# Patient Record
Sex: Female | Born: 1965
Health system: Southern US, Community
[De-identification: ages and names within clinical notes are randomized; demographics above are authoritative.]

## PROBLEM LIST (undated history)

## (undated) DIAGNOSIS — D649 Anemia, unspecified: Secondary | ICD-10-CM

## (undated) DIAGNOSIS — M419 Scoliosis, unspecified: Secondary | ICD-10-CM

## (undated) HISTORY — DX: Scoliosis, unspecified: M41.9

## (undated) HISTORY — PX: TUBAL LIGATION: SHX77

## (undated) SURGERY — EGD (ESOPHAGOGASTRODUODENOSCOPY)
Anesthesia: Moderate Sedation

---

## 2011-05-14 ENCOUNTER — Ambulatory Visit (INDEPENDENT_AMBULATORY_CARE_PROVIDER_SITE_OTHER): Payer: BC Managed Care – PPO | Admitting: Internal Medicine

## 2011-05-14 DIAGNOSIS — K625 Hemorrhage of anus and rectum: Secondary | ICD-10-CM

## 2011-05-14 DIAGNOSIS — Z800 Family history of malignant neoplasm of digestive organs: Secondary | ICD-10-CM

## 2011-05-14 DIAGNOSIS — D649 Anemia, unspecified: Secondary | ICD-10-CM

## 2011-06-02 NOTE — Consult Note (Signed)
[name], [name] NO.:  [id number]  MEDICAL RECORD NO.:  [id number]  LOCATION:                                 FACILITY:  PHYSICIAN:  Lionel December, M.D.    DATE OF BIRTH:  Jun 02, 1966  DATE OF CONSULTATION:  05/14/2011 DATE OF DISCHARGE:                                CONSULTATION   REASON FOR CONSULTATION:  Anemia, rectal bleeding.  HISTORY OF PRESENT ILLNESS:  Misty Mcdowell is a 45 year old female, referred to our office by Dr. Neita Carp for anemia.  She actually told Dr. Neita Carp that she had been seeing blood on the toilet tissue for a while.  On Apr 15, 2011, her hemoglobin was 12.7, hematocrit was 38.7, her MCV was 89.  She had actually had seen a dermatologist due to hair loss and her iron levels were checked.  It was noted her iron was 22, her ferritin was 6, her UIBC was 369, and her TIBC was 391.  She was started on iron at that time by her dermatologist.  She says her iron was rechecked and it was still low and she was sent to see Dr. Neita Carp.  She does tell me that she has actually had rectal bleeding since she had childbirth in 1992.  Her appetite is good.  There has been no unintentional weight loss.  She usually has a bowel movement a day.  They are dark in color or green at this time due to the iron.  Her last menstrual period was 26 days ago and she describes it as very heavy.  She has seen her gynecologist for her heavy vaginal bleeding and they are going to proceed with a Mirena for her. She does complain of being tired.  She denies pica.  There is no abdominal pain.  No dysphagia.  She denies any melena.  There is a family history of colon cancer in a first-degree relative.  Her father was diagnosed with colon cancer in 2010.  He was 43 years old.  ALLERGIES:  There are no known allergies.  HOME MEDICATIONS:  Include, iron 65 mg two a day, sumatriptan 50 mg as needed, ciclopirox nail solution for fungal infection, and  Excedrin p.r.n.  SURGERIES:  She has had a tubal ligation in 1992, anemia of new onset, migraines.  Please note that the patient is a Jehovah Witness and she does not want blood products.  FAMILY HISTORY:  Her mother is alive in good health.  Her father is alive with a history of colon cancer.  He was diagnosed in 2010 by Dr. Karilyn Cota at 62.  She has one brother in good health.  SOCIAL HISTORY:  She is married.  She works at [employer].  She does not smoke, drink, or do drugs, and she has two children in good health.  OBJECTIVE:  VITAL SIGNS:  Her weight is 143, her height is 5 feet 2-1/2 inches, temperature is 98.2, blood pressure is 94/62, and pulse is 72. HEENT:  She has natural teeth.  Her oral mucosa is moist.  There are no lesions.  Her conjunctivae is pink.  Her sclerae are anicteric.  Her thyroid is normal.  There is no cervical lymphadenopathy.  LUNGS:  Clear. HEART:  Regular rate and rhythm. ABDOMEN:  Soft.  Bowel sounds are positive.  No masses.  Her stool was dark today, but it was guaiac negative. EXTREMITIES:  There is no edema to her lower extremities.  ASSESSMENT:  Misty Mcdowell is a 45 year old female, presenting with a low ferritin.  However, her CBC is normal.  She has seen rectal bleeding off and on since childbirth in 1992.  A colonic neoplasm needs to be ruled out.  There is a positive family history of colon cancer in a first- degree relative. Hemorrhoids, polyps, AVMs are in the differential.  RECOMMENDATIONS:  Would like to schedule a colonoscopy in near future.   The risk were reviewed with the patient and the patient would like to  proceed with the procedure.    ______________________________ Dorene Ar, NP   ______________________________ Lionel December, M.D.    TS/MEDQ  D:  05/14/2011  T:  05/15/2011  Job:  [id number]  Electronically Signed by Dorene Ar PA on 05/15/2011 10:03:54 AM Electronically Signed by Lionel December M.D. on 06/02/2011 12:45:24 AM

## 2011-06-25 MED ORDER — SODIUM CHLORIDE 0.45 % IV SOLN: Freq: Once | INTRAVENOUS | Status: DC

## 2011-06-26 ENCOUNTER — Encounter (HOSPITAL_COMMUNITY): Payer: Self-pay | Admitting: *Deleted

## 2011-06-26 ENCOUNTER — Encounter (INDEPENDENT_AMBULATORY_CARE_PROVIDER_SITE_OTHER): Payer: BC Managed Care – PPO | Admitting: Internal Medicine

## 2011-06-26 ENCOUNTER — Other Ambulatory Visit (INDEPENDENT_AMBULATORY_CARE_PROVIDER_SITE_OTHER): Payer: Self-pay | Admitting: Internal Medicine

## 2011-06-26 ENCOUNTER — Ambulatory Visit (HOSPITAL_COMMUNITY)
Admission: RE | Admit: 2011-06-26 | Discharge: 2011-06-26 | Disposition: A | Discharge status: Home / Self Care | Payer: BC Managed Care – PPO | Source: Ambulatory Visit | Attending: Internal Medicine | Admitting: Internal Medicine

## 2011-06-26 ENCOUNTER — Encounter (HOSPITAL_COMMUNITY)
Admission: RE | Disposition: A | Discharge status: Home / Self Care | Payer: Self-pay | Source: Ambulatory Visit | Attending: Internal Medicine

## 2011-06-26 DIAGNOSIS — K644 Residual hemorrhoidal skin tags: Secondary | ICD-10-CM

## 2011-06-26 DIAGNOSIS — K5730 Diverticulosis of large intestine without perforation or abscess without bleeding: Secondary | ICD-10-CM

## 2011-06-26 DIAGNOSIS — Z800 Family history of malignant neoplasm of digestive organs: Secondary | ICD-10-CM | POA: Insufficient documentation

## 2011-06-26 DIAGNOSIS — K921 Melena: Secondary | ICD-10-CM

## 2011-06-26 DIAGNOSIS — K620 Anal polyp: Secondary | ICD-10-CM | POA: Insufficient documentation

## 2011-06-26 DIAGNOSIS — D509 Iron deficiency anemia, unspecified: Secondary | ICD-10-CM

## 2011-06-26 DIAGNOSIS — D129 Benign neoplasm of anus and anal canal: Secondary | ICD-10-CM

## 2011-06-26 DIAGNOSIS — D128 Benign neoplasm of rectum: Secondary | ICD-10-CM

## 2011-06-26 HISTORY — PX: COLONOSCOPY: SHX5424

## 2011-06-26 HISTORY — DX: Anemia, unspecified: D64.9

## 2011-06-26 SURGERY — COLONOSCOPY
Anesthesia: Moderate Sedation

## 2011-06-26 MED ORDER — MEPERIDINE HCL 50 MG/ML IJ SOLN
INTRAMUSCULAR | Status: DC | PRN
  Administered 2011-06-26 (×2): 25 mg via INTRAVENOUS

## 2011-06-26 MED ORDER — MEPERIDINE HCL 50 MG/ML IJ SOLN
INTRAMUSCULAR | Status: AC
  Filled 2011-06-26: qty 1

## 2011-06-26 MED ORDER — MIDAZOLAM HCL 5 MG/5ML IJ SOLN
INTRAMUSCULAR | Status: AC
  Filled 2011-06-26: qty 10

## 2011-06-26 MED ORDER — MIDAZOLAM HCL 5 MG/5ML IJ SOLN
INTRAMUSCULAR | Status: DC | PRN
  Administered 2011-06-26: 1 mg via INTRAVENOUS
  Administered 2011-06-26 (×2): 2 mg via INTRAVENOUS

## 2011-06-26 NOTE — Op Note (Signed)
NAMEANOKHI, Misty Mcdowell             ACCOUNT NO.:  [id number]  MEDICAL RECORD NO.:  [id number]  LOCATION:  [facility]                          FACILITY:  [facility]  PHYSICIAN:  Lionel December, M.D.    DATE OF BIRTH:  09-23-1966  DATE OF PROCEDURE:  06/26/2011 DATE OF DISCHARGE:  06/26/2011                              OPERATIVE REPORT   PROCEDURE:  Colonoscopy.  INDICATION:  Misty Mcdowell is a 45 year old Caucasian female who has a persistently low serum iron and has not responded to oral iron therapy, however, her hemoglobin has been normal.  She does complain of intermittent hematochezia thought to be secondary to hemorrhoids.  She does not have chronic diarrhea or weight loss.  Family history is significant for colon carcinoma in a father who had surgery at age 34 and doing well.  Her mother has had multiple polyps removed.  This is the patient's first exam.  MEDICATIONS FOR CONSCIOUS SEDATION:  Demerol 50 mg IV and Versed 5 mg IV.  FINDINGS:  Procedure performed in endoscopy suite.  The patient's vital signs and O2 sat were monitored during the procedure and remained stable.  The patient was placed in left lateral recumbent position. Rectal examination was performed.  No abnormality noted on external or digital exam.  Pentax videoscope was placed in rectum and advanced under vision into sigmoid colon and beyond.  Preparation was excellent.  She had a single small diverticulum at sigmoid colon.  Scope was passed into cecum which was identified by appendiceal orifice and ileocecal valve. Short segment of GI was also examined and was normal.  As the scope was withdrawn, colonic mucosa was carefully examined.  There was a 3-mm polyp at rectum which was ablated via cold biopsy.  Scope was retroflexed to examine anorectal junction and small hemorrhoids noted below the dentate line.  Endoscope was withdrawn.  Withdrawal time was 7 minutes.  The patient tolerated the procedure well.  FINAL  DIAGNOSES: 1. Normal terminal ileum. 2. Single small diverticulum at sigmoid colon. 3. A 3-mm rectal polyp removed via cold biopsy. 4. External hemorrhoids.  RECOMMENDATIONS: 1. The patient advised to go back on her iron pills as before. 2. I will be contacting the results of biopsy next week. 3. She will have follow up H and H and iron studies by Dr. Neita Carp in 8     weeks or so. 4. Given the family history, she should consider having a next     colonoscopy in 5 years.          ______________________________ Lionel December, M.D.     NR/MEDQ  D:  06/26/2011  T:  06/26/2011  Job:  [id number]  cc:   Fara Chute, MD Fax: [phone]

## 2011-06-26 NOTE — Brief Op Note (Signed)
06/26/2011  12:15 PM  PATIENT:  Misty Mcdowell  45 y.o. female  PRE-OPERATIVE DIAGNOSIS:  anemia, rectal bleeding,f/h crc  POST-OPERATIVE DIAGNOSIS:  sigmoid diverticula, 3mm rectal polyp, external hemorroids  PROCEDURE:  Procedure(s): COLONOSCOPY  SURGEON:  Surgeon(s): Malissa Hippo, MD   Colonoscopy note; Normal TI; Single sigmoid diverticulum; 3 mm rectal polyp removed via cold biopsy; External hemorrhoids.

## 2011-06-26 NOTE — H&P (Signed)
Misty Mcdowell is an 45 y.o. female.   Chief Complaint: For a colonoscopy. Persistently low serum iron levels and family history of colon carcinoma HPI: Patient is 45 year old Caucasian female with a persistently low serum iron levels despite taking oral iron for 4 months. She has occasional hematochezia felt to be secondary to hemorrhoids. Her periods according to her have never been heavy she had an IUD placed about a month ago. She denies anorexia weight loss diarrhea abdominal pain or heartburn. He does not take any NSAIDs on a regular basis. Father had surgery for colorectal carcinoma at age 14 and is doing well. Mother has had multiple polyps removed.  Past Medical History  Diagnosis Date  . Anemia     Past Surgical History  Procedure Date  . Tubal ligation     No family history on file. Social History:  reports that she has never smoked. She does not have any smokeless tobacco history on file. She reports that she does not drink alcohol or use illicit drugs.  Allergies: No Known Allergies  Medications Prior to Admission  Medication Dose Route Frequency Provider Last Rate Last Dose  . 0.45 % sodium chloride infusion   Intravenous Once Malissa Hippo, MD      . meperidine (DEMEROL) 50 MG/ML injection           . midazolam (VERSED) 5 MG/5ML injection            Medications Prior to Admission  Medication Sig Dispense Refill  . Iron 66 MG TABS Take by mouth 2 (two) times daily.          No results found for this or any previous visit (from the past 48 hour(s)). No results found.  Review of Systems  Gastrointestinal: Positive for blood in stool (intermittent with bowel movements always small volume). Negative for heartburn, nausea, vomiting, abdominal pain, diarrhea and constipation.    Blood pressure 109/65, pulse 58, temperature 98.8 F (37.1 C), temperature source Oral, resp. rate 18, height 5' 2.5" (1.588 m), weight 137 lb (62.143 kg), last menstrual period  06/06/2011, SpO2 98.00%. Physical Exam  Constitutional: She appears well-developed and well-nourished.  HENT:  Mouth/Throat: Oropharynx is clear and moist.  Eyes: Conjunctivae are normal. No scleral icterus.  Neck: No thyromegaly present.  Cardiovascular: Normal rate, regular rhythm and normal heart sounds.   No murmur heard. Respiratory: Breath sounds normal.  GI: Soft. She exhibits no distension. There is no tenderness.  Musculoskeletal: She exhibits no edema.  Lymphadenopathy:    She has no cervical adenopathy.  Neurological: She is alert.  Skin: Skin is warm and dry.     Assessment/Plan Iron deficiency. Hematochezia Family history of colorectal carcinoma Will proceed with colonoscopy sui generis and reviewed with the patient and she is agreeable  Plumer Mittelstaedt U 06/26/2011, 11:49 AM

## 2011-07-01 ENCOUNTER — Encounter (HOSPITAL_COMMUNITY): Payer: Self-pay | Admitting: Internal Medicine

## 2011-07-02 ENCOUNTER — Other Ambulatory Visit: Payer: Self-pay | Admitting: Dermatology

## 2011-07-15 ENCOUNTER — Encounter (INDEPENDENT_AMBULATORY_CARE_PROVIDER_SITE_OTHER): Payer: Self-pay | Admitting: *Deleted

## 2014-04-18 ENCOUNTER — Telehealth: Payer: Self-pay | Admitting: *Deleted

## 2014-04-18 ENCOUNTER — Ambulatory Visit (INDEPENDENT_AMBULATORY_CARE_PROVIDER_SITE_OTHER): Payer: BC Managed Care – PPO | Admitting: Podiatry

## 2014-04-18 ENCOUNTER — Encounter: Payer: Self-pay | Admitting: Podiatry

## 2014-04-18 VITALS — BP 118/71 | HR 73 | Resp 16 | Ht 62.0 in | Wt 139.0 lb

## 2014-04-18 DIAGNOSIS — L608 Other nail disorders: Secondary | ICD-10-CM

## 2014-04-18 NOTE — Telephone Encounter (Signed)
Mycotic Toenails fragments sent to [facility] for fungal culture/ pas.  [facility] [place] [address] Ph: [phone]

## 2014-04-18 NOTE — Progress Notes (Signed)
   Subjective:    Patient ID: Misty Mcdowell, female    DOB: Feb 01, 1966, 48 y.o.   MRN: [id number]  HPI Comments: "I have these bad nails"  Patient c/o thick, discolored toenails, 2nd and 3rd toenails left, for several years, off and on. The toes are sore. She has been on lamisil 3 times, with one successful treatment. She has been using Vick's vaporub at home. No change.  She also has a concern that her feet burn only when its really hot outside-plantar bilateral. "The skin is hot"       Review of Systems  Musculoskeletal: Positive for back pain.  Skin:       Change in nails  All other systems reviewed and are negative.      Objective:   Physical Exam: I have reviewed her past medical history medications allergies surgeries social history and review of systems. Pulses are strongly palpable bilateral. Second and third toes of the left foot demonstrate thick yellow dystrophic possibly mycotic nail plate. Neurologic sensorium is intact per since once the monofilament deep tendon reflexes are intact bilateral. Muscle strength is 5 over 5 flexors plantar flexors inverters everters all intrinsic musculature is intact. Orthopedic evaluation demonstrates all joints distal to the ankle a full range of motion without crepitation mild flexible hammertoe deformities are noted bilateral left greater than right.        Assessment & Plan:  Assessment: Nail dystrophy possible onychomycosis second third digits of the left foot.  Plan: Discussed etiology pathology conservative or surgical therapies nail samples were taken today from toes #2 #3 of the left foot. I will followup with her once her samples return.

## 2014-04-18 NOTE — Patient Instructions (Signed)

## 2014-05-04 ENCOUNTER — Encounter: Payer: Self-pay | Admitting: Podiatry

## 2014-05-23 ENCOUNTER — Ambulatory Visit (INDEPENDENT_AMBULATORY_CARE_PROVIDER_SITE_OTHER): Payer: BC Managed Care – PPO | Admitting: Podiatry

## 2014-05-23 ENCOUNTER — Encounter: Payer: Self-pay | Admitting: Podiatry

## 2014-05-23 VITALS — BP 138/79 | HR 72 | Resp 16

## 2014-05-23 DIAGNOSIS — B351 Tinea unguium: Secondary | ICD-10-CM

## 2014-05-23 DIAGNOSIS — Z79899 Other long term (current) drug therapy: Secondary | ICD-10-CM

## 2014-05-23 LAB — HEPATIC FUNCTION PANEL
ALBUMIN: 4.4 g/dL (ref 3.5–5.2)
ALK PHOS: 36 U/L — AB (ref 39–117)
ALT: 21 U/L (ref 0–35)
AST: 21 U/L (ref 0–37)
BILIRUBIN INDIRECT: 0.4 mg/dL (ref 0.2–1.2)
Bilirubin, Direct: 0.1 mg/dL (ref 0.0–0.3)
TOTAL PROTEIN: 7 g/dL (ref 6.0–8.3)
Total Bilirubin: 0.5 mg/dL (ref 0.2–1.2)

## 2014-05-23 MED ORDER — TERBINAFINE HCL 250 MG PO TABS: 250.0000 mg | ORAL_TABLET | Freq: Every day | ORAL | Status: DC

## 2014-05-23 NOTE — Patient Instructions (Signed)

## 2014-05-23 NOTE — Progress Notes (Signed)
She presents today for a followup of her toenail results.  Objective: Vital signs are stable she is alert and oriented x3 no change in her nail plates as of yet. Her nail culture came back positive for saprophytic fungi..  Assessment: Onychomycosis.  Plan: Start her on long-term dose of Lamisil blood work was performed today we will continue to evaluate as necessary. She has had Lamisil for once the treated her toenails very well she then re\re grew the fungus and Lamisil did not treat them previously so at this point we're going to restart Lamisil see how this works for her in followup with her in one month for another set of blood or bile. Should this come back abnormal we will notify her immediately.

## 2014-06-05 ENCOUNTER — Telehealth: Payer: Self-pay | Admitting: *Deleted

## 2014-06-05 NOTE — Telephone Encounter (Signed)
Message copied by Lolita Rieger on Mon Jun 05, 2014 10:12 AM ------      Message from: Clovis Riley E      Created: Mon Jun 05, 2014  8:31 AM                   ----- Message -----         From: Garrel Ridgel, DPM         Sent: 05/23/2014   4:47 PM           To: Rip Harbour, [facility]            Liver profile looks good may continue medication. ------

## 2014-06-05 NOTE — Telephone Encounter (Signed)
I called and informed the patient that Dr. Milinda Pointer reviewed the lab results and stated everything looks good.  Continue the medication.  She stated okay, thank you.

## 2014-06-20 ENCOUNTER — Ambulatory Visit (INDEPENDENT_AMBULATORY_CARE_PROVIDER_SITE_OTHER): Payer: BC Managed Care – PPO | Admitting: Podiatry

## 2014-06-20 ENCOUNTER — Encounter: Payer: Self-pay | Admitting: Podiatry

## 2014-06-20 DIAGNOSIS — Z79899 Other long term (current) drug therapy: Secondary | ICD-10-CM

## 2014-06-20 LAB — HEPATIC FUNCTION PANEL
ALT: 10 U/L (ref 0–35)
AST: 13 U/L (ref 0–37)
Albumin: 4.2 g/dL (ref 3.5–5.2)
Alkaline Phosphatase: 33 U/L — ABNORMAL LOW (ref 39–117)
Bilirubin, Direct: 0.1 mg/dL (ref 0.0–0.3)
Indirect Bilirubin: 0.3 mg/dL (ref 0.2–1.2)
Total Bilirubin: 0.4 mg/dL (ref 0.2–1.2)
Total Protein: 6.5 g/dL (ref 6.0–8.3)

## 2014-06-20 MED ORDER — TERBINAFINE HCL 250 MG PO TABS: 250.0000 mg | ORAL_TABLET | Freq: Every day | ORAL | Status: DC

## 2014-06-20 NOTE — Progress Notes (Signed)
She presents today for followup of Lamisil therapy for treatment of onychomycosis. She denies fever chills nausea vomiting muscle aches pains and rashes.  Objective: Pulses are stable she is alert and oriented x3. Pulses are strongly palpable bilateral. No change in onychomycosis as of yet.  Assessment: Onychomycosis been treated with Lamisil therapy.  Plan: Another liver profile will be performed today and she was given another prescription for Lamisil 250 mg tablets 1 by mouth daily x90 days. I will followup with her in 4 weeks and call her should her blood work come back abnormal.

## 2014-06-21 ENCOUNTER — Telehealth: Payer: Self-pay | Admitting: *Deleted

## 2014-06-21 NOTE — Telephone Encounter (Signed)
Message copied by Dierdre Searles on Wed Jun 21, 2014  3:30 PM ------      Message from: Tyson Dense T      Created: Tue Jun 20, 2014  4:06 PM       ok ------

## 2014-06-21 NOTE — Telephone Encounter (Signed)
Called patient to let her know that her bloodwork was good

## 2014-10-12 ENCOUNTER — Ambulatory Visit (INDEPENDENT_AMBULATORY_CARE_PROVIDER_SITE_OTHER): Payer: BC Managed Care – PPO | Admitting: Podiatry

## 2014-10-12 VITALS — BP 142/82 | HR 71 | Resp 16

## 2014-10-12 DIAGNOSIS — B351 Tinea unguium: Secondary | ICD-10-CM

## 2014-10-13 NOTE — Progress Notes (Signed)
She presents today for follow-up of her Lamisil therapy for her toes #3 and 4 for left foot. She states she finished the Lamisil therapy and has seen no change with the toenails.  Objective: Vital signs are stable she is alert and oriented 3. Onychomycosis with nail dystrophy toes 3 and 4 left foot.  Plan follow up with me in January for laser therapy.

## 2014-10-24 ENCOUNTER — Ambulatory Visit: Payer: BC Managed Care – PPO | Admitting: Podiatry

## 2015-09-19 ENCOUNTER — Ambulatory Visit (INDEPENDENT_AMBULATORY_CARE_PROVIDER_SITE_OTHER): Payer: BLUE CROSS/BLUE SHIELD | Admitting: Internal Medicine

## 2015-09-19 ENCOUNTER — Encounter (INDEPENDENT_AMBULATORY_CARE_PROVIDER_SITE_OTHER): Payer: Self-pay | Admitting: Internal Medicine

## 2015-09-19 ENCOUNTER — Encounter (INDEPENDENT_AMBULATORY_CARE_PROVIDER_SITE_OTHER): Payer: Self-pay | Admitting: *Deleted

## 2015-09-19 ENCOUNTER — Other Ambulatory Visit (INDEPENDENT_AMBULATORY_CARE_PROVIDER_SITE_OTHER): Payer: Self-pay | Admitting: Internal Medicine

## 2015-09-19 VITALS — BP 102/60 | HR 89 | Temp 98.0°F | Ht 62.5 in | Wt 128.5 lb

## 2015-09-19 DIAGNOSIS — R140 Abdominal distension (gaseous): Secondary | ICD-10-CM

## 2015-09-19 DIAGNOSIS — R1013 Epigastric pain: Secondary | ICD-10-CM

## 2015-09-19 DIAGNOSIS — M4120 Other idiopathic scoliosis, site unspecified: Secondary | ICD-10-CM | POA: Insufficient documentation

## 2015-09-19 MED ORDER — OMEPRAZOLE 20 MG PO CPDR: 20.0000 mg | DELAYED_RELEASE_CAPSULE | Freq: Every day | ORAL | Status: DC

## 2015-09-19 NOTE — Patient Instructions (Addendum)
EGD. The risks and benefits such as perforation, bleeding, and infection were reviewed with the patient and is agreeable. Rx for Omeprazole 20mg 

## 2015-09-19 NOTE — Progress Notes (Signed)
   Subjective:    Patient ID: Misty Mcdowell, female    DOB: [birth year], 49 y.o.   MRN: [id number]  HPI Presents today with c/o bloating and epigastric pain.  She says it is worse after a meal. The puffiness in her epigastric region never goes away completely. She denies any acid reflux. She has tried Prilosec but did not see any difference. No nausea or vomiting.  She was last seen by Dr. Doristine Mango for bloating and epigastric pain. She tells me she has bloating sensation above her umbilicus. She was constipated but is better since starting the Linzess. She has lost 15 pounds intentionally to try to reduce to bloating. Her appetite is good. She has a BM once every 3-4 days. Stools are soft.  No melena or BRRB.  She stopped the Excedrin for Migraines in April.    07/23/2015 US pelvic complete: Stable small uterine fibroids, IUD visualized within endometrial cavity. 2.5 cm complex left ovarian cyst, with; inderterminate but probably benign characteristics. Continued f/u by transvaginal pelvis US 6-12 weeks recommended.   04/02/2015 Transvaginal US: pelvic pressure, pain, bloating x 1 month. Within the left ovary there is a complicated cystic lesion with suggestion of internal septations and surround blood flow. Findings may represent a resolving corpus luteum or multiple adjacent cysts however not definitive for this etiology. Recommend f/u pelvic US in 6-8 weeks to evaluate for interval resolution.    03/08/2015 US abdomen: abdominal pain and bloating:  Normal exam.  CBC. 3.5  06/26/2011 Colonoscopy. Dr. Laural Golden  INDICATION: Misty Mcdowell is a 49 year old Caucasian female who has a persistently low serum iron and has not responded to oral iron therapy, however, her hemoglobin has been normal. She does complain of intermittent hematochezia thought to be secondary to hemorrhoids. She does not have chronic diarrhea or weight loss. Family history is significant for colon carcinoma in a  father who had surgery at age 20 and doing well. Her mother has had multiple polyps removed. 1. Normal terminal ileum. 2. Single small diverticulum at sigmoid colon. 3. A 3-mm rectal polyp removed via cold biopsy. 4. External hemorrhoids. Biopsy: polypoid colorectal mucosa with lymphoid aggregate. Review of Systems     Past Medical History  Diagnosis Date  . Anemia   . Scoliosis     Past Surgical History  Procedure Laterality Date  . Tubal ligation    . Colonoscopy  06/26/2011    Procedure: COLONOSCOPY;  Surgeon: Rogene Houston, MD;  Location: [facility];  Service: Endoscopy;  Laterality: N/A;    No Known Allergies  No current outpatient prescriptions on file prior to visit.   No current facility-administered medications on file prior to visit.     Objective:   Physical Exam Blood pressure 102/60, pulse 89, temperature 98 F (36.7 C), height 5' 2.5" (1.588 m), weight 128 lb 8 oz (58.287 kg). Alert and oriented. Skin warm and dry. Oral mucosa is moist.   . Sclera anicteric, conjunctivae is pink. Thyroid not enlarged. No cervical lymphadenopathy. Lungs clear. Heart regular rate and rhythm.  Abdomen is soft. Bowel sounds are positive. No hepatomegaly. No abdominal masses felt. Tenderness epigastric region.   No edema to lower extremities.        Assessment & Plan:  Epigastric tenderness. PUD needs to be ruled out. EGD.The risks and benefits such as perforation, bleeding, and infection were reviewed with the patient and is agreeable.

## 2015-09-26 ENCOUNTER — Encounter (HOSPITAL_COMMUNITY): Payer: Self-pay | Admitting: *Deleted

## 2015-09-26 ENCOUNTER — Encounter (HOSPITAL_COMMUNITY)
Admission: RE | Disposition: A | Discharge status: Home / Self Care | Payer: Self-pay | Source: Ambulatory Visit | Attending: Internal Medicine

## 2015-09-26 ENCOUNTER — Ambulatory Visit (HOSPITAL_COMMUNITY)
Admission: RE | Admit: 2015-09-26 | Discharge: 2015-09-26 | Disposition: A | Discharge status: Home / Self Care | Payer: BLUE CROSS/BLUE SHIELD | Source: Ambulatory Visit | Attending: Internal Medicine | Admitting: Internal Medicine

## 2015-09-26 DIAGNOSIS — Z79899 Other long term (current) drug therapy: Secondary | ICD-10-CM | POA: Diagnosis not present

## 2015-09-26 DIAGNOSIS — K2970 Gastritis, unspecified, without bleeding: Secondary | ICD-10-CM | POA: Insufficient documentation

## 2015-09-26 DIAGNOSIS — B9681 Helicobacter pylori [H. pylori] as the cause of diseases classified elsewhere: Secondary | ICD-10-CM | POA: Diagnosis not present

## 2015-09-26 DIAGNOSIS — K449 Diaphragmatic hernia without obstruction or gangrene: Secondary | ICD-10-CM | POA: Insufficient documentation

## 2015-09-26 DIAGNOSIS — K3189 Other diseases of stomach and duodenum: Secondary | ICD-10-CM | POA: Insufficient documentation

## 2015-09-26 DIAGNOSIS — R1013 Epigastric pain: Secondary | ICD-10-CM

## 2015-09-26 DIAGNOSIS — R140 Abdominal distension (gaseous): Secondary | ICD-10-CM | POA: Insufficient documentation

## 2015-09-26 DIAGNOSIS — R110 Nausea: Secondary | ICD-10-CM | POA: Insufficient documentation

## 2015-09-26 DIAGNOSIS — K5909 Other constipation: Secondary | ICD-10-CM | POA: Diagnosis not present

## 2015-09-26 HISTORY — PX: ESOPHAGOGASTRODUODENOSCOPY: SHX5428

## 2015-09-26 SURGERY — EGD (ESOPHAGOGASTRODUODENOSCOPY)
Anesthesia: Moderate Sedation

## 2015-09-26 MED ORDER — STERILE WATER FOR IRRIGATION IR SOLN
Status: DC | PRN
  Administered 2015-09-26: 15:00:00

## 2015-09-26 MED ORDER — MEPERIDINE HCL 50 MG/ML IJ SOLN
INTRAMUSCULAR | Status: AC
  Filled 2015-09-26: qty 1

## 2015-09-26 MED ORDER — DICYCLOMINE HCL 10 MG PO CAPS: 10.0000 mg | ORAL_CAPSULE | Freq: Three times a day (TID) | ORAL | Status: DC | PRN

## 2015-09-26 MED ORDER — MIDAZOLAM HCL 5 MG/5ML IJ SOLN
INTRAMUSCULAR | Status: DC | PRN
  Administered 2015-09-26 (×2): 2 mg via INTRAVENOUS
  Administered 2015-09-26: 1 mg via INTRAVENOUS

## 2015-09-26 MED ORDER — SODIUM CHLORIDE 0.9 % IV SOLN
INTRAVENOUS | Status: DC
  Administered 2015-09-26: 1000 mL via INTRAVENOUS

## 2015-09-26 MED ORDER — MEPERIDINE HCL 50 MG/ML IJ SOLN
INTRAMUSCULAR | Status: DC | PRN
  Administered 2015-09-26 (×2): 25 mg via INTRAVENOUS

## 2015-09-26 MED ORDER — MIDAZOLAM HCL 5 MG/5ML IJ SOLN
INTRAMUSCULAR | Status: AC
  Filled 2015-09-26: qty 10

## 2015-09-26 NOTE — Op Note (Signed)
EGD PROCEDURE REPORT  PATIENT:  Misty Mcdowell  MR#:  [id number] Birthdate:  [birth year], 49 y.o., female Endoscopist:  Dr. Rogene Houston, MD Referred By:  Dr. Eddie Dibbles w. Sasser, MD Procedure Date: 09/26/2015  Procedure:   EGD  Indications:   Patient is 49 year old Caucasian female who presents with 6-7 month history of epigastric pain postprandial bloating nausea and she's had 2 episodes of emesis. No history of melena. She has not responded to PPI therapy. She had upper abdominal ultrasound by Dr. Consuello Masse and was negative for cholelithiasis or other abnormalities. Patient's LFTs were normal. She has not responded to PPI therapy. She is undergoing diagnostic EGD.           Informed Consent:  The risks, benefits, alternatives & imponderables which include, but are not limited to, bleeding, infection, perforation, drug reaction and potential missed lesion have been reviewed.  The potential for biopsy, lesion removal, esophageal dilation, etc. have also been discussed.  Questions have been answered.  All parties agreeable.  Please see history & physical in medical record for more information.  Medications:  Demerol 50 mg IV Versed 5 mg IV Cetacaine spray topically for oropharyngeal anesthesia  Description of procedure:  The endoscope was introduced through the mouth and advanced to the second portion of the duodenum without difficulty or limitations. The mucosal surfaces were surveyed very carefully during advancement of the scope and upon withdrawal.  Findings:  Esophagus:   Mucosa of the esophagus was normal. GE junction was wavy otherwise unremarkable. GEJ:  34 cm Hiatus:  36 cm Stomach:   Stomach was empty and distended very well with insufflation. Folds in the proximal stomach were normal. Examination of mucosa at gastric body revealed patchy erythema and mosaic pattern. Antral mucosa was normal. Pyloric channel was patent. Angularis was unremarkable. Fundus and cardia were also seen  on retroflex view and no other abnormalities noted. Duodenum:   Normal bulbar and post bulbar mucosa.  Therapeutic/Diagnostic Maneuvers Performed:   Multiple biopsies taken from mucosa gastric body for routine histology.  Complications:   none  EBL: Minimal  Impression:  Small sliding hiatal hernia.  Mild portal gastropathy. Biopsy taken from mucosa of gastric body for routine histology.   These findings would not explain patient's symptom complex..   Recommendations:   Standard instructions given.  Dicyclomine 10 mg by mouth 3 times a day when necessary.  I would be contacting patient with biopsy results and further recommendations.  Jaquan Sadowsky U  09/26/2015  3:36 PM  CC: Dr. Manon Hilding, MD & Dr. Rayne Du ref. provider found

## 2015-09-26 NOTE — H&P (Signed)
Misty Mcdowell is an 49 y.o. female.   Chief Complaint:   Patient is here for EGD. HPI:  Patient is 49 year old Caucasian female who presents with 6-7 month history of epigastric pain postprandial bloating nausea. She took omeprazole for 2 weeks but it did not help. She went back on and one week ago. She denies melena or rectal bleeding. She has had 2 episodes of emesis but no hematemesis. She denies melena or rectal bleeding. She has chronic constipation and she feels she is doing very well with linzess.  She has bowel movement every 3 days. He denies frequent heartburn or dysphagia.  She is presently not taking any OTC NSAIDs. In the past she has taken maybe 3-4 times a month. She has lost 15 pounds but she states her weight loss is voluntary.  Abdominal ultrasound was negative for cholelithiasis.  Patient does not smoke cigarettes and drinks alcohol socially but not daily.  Past Medical History  Diagnosis Date  . Anemia   . Scoliosis     Past Surgical History  Procedure Laterality Date  . Tubal ligation    . Colonoscopy  06/26/2011    Procedure: COLONOSCOPY;  Surgeon: Rogene Houston, MD;  Location: [facility];  Service: Endoscopy;  Laterality: N/A;    History reviewed. No pertinent family history. Social History:  reports that she has never smoked. She does not have any smokeless tobacco history on file. She reports that she drinks alcohol. She reports that she does not use illicit drugs.  Allergies: No Known Allergies  Medications Prior to Admission  Medication Sig Dispense Refill  . cholecalciferol (VITAMIN D) 1000 UNITS tablet Take 1,000 Units by mouth daily.    . Fish Oil-Cholecalciferol (FISH OIL + D3 PO) Take by mouth.    . Linaclotide (LINZESS) 145 MCG CAPS capsule Take 145 mcg by mouth daily.    Marland Kitchen omeprazole (PRILOSEC) 20 MG capsule Take 1 capsule (20 mg total) by mouth daily. 90 capsule 3    No results found for this or any previous visit (from the past 48  hour(s)). No results found.  ROS  Blood pressure 114/72, pulse 63, temperature 98.3 F (36.8 C), temperature source Oral, resp. rate 18, height 5' 2.5" (1.588 m), weight 127 lb (57.607 kg), SpO2 100 %. Physical Exam  Constitutional: She appears well-developed and well-nourished.  HENT:  Mouth/Throat: Oropharynx is clear and moist.  Eyes: Conjunctivae are normal. No scleral icterus.  Neck: No thyromegaly present.  Cardiovascular: Normal rate, regular rhythm and normal heart sounds.   No murmur heard. Respiratory: Effort normal and breath sounds normal.  GI: Soft. She exhibits no mass. There is tenderness ( mild midepigastric tenderness). There is no rebound.  Musculoskeletal: She exhibits no edema.  Lymphadenopathy:    She has no cervical adenopathy.  Neurological: She is alert.  Skin: Skin is warm and dry.     Assessment/Plan  Epigastric pain postprandial bloating and nausea.  Diagnostic EGD.  REHMAN,NAJEEB U 09/26/2015, 3:12 PM

## 2015-09-26 NOTE — Discharge Instructions (Signed)
Resume usual medications and diet.  Dicyclomine 10 mg by mouth 3 times a day as needed or 30 minutes before each meal.  No driving for 24 hours.  Physician will call with biopsy results and further recommendations.  Gastrointestinal Endoscopy, Care After Refer to this sheet in the next few weeks. These instructions provide you with information on caring for yourself after your procedure. Your caregiver may also give you more specific instructions. Your treatment has been planned according to current medical practices, but problems sometimes occur. Call your caregiver if you have any problems or questions after your procedure. HOME CARE INSTRUCTIONS  If you were given medicine to help you relax (sedative), do not drive, operate machinery, or sign important documents for 24 hours.  Avoid alcohol and hot or warm beverages for the first 24 hours after the procedure.  Only take over-the-counter or prescription medicines for pain, discomfort, or fever as directed by your caregiver. You may resume taking your normal medicines unless your caregiver tells you otherwise. Ask your caregiver when you may resume taking medicines that may cause bleeding, such as aspirin, clopidogrel, or warfarin.  You may return to your normal diet and activities on the day after your procedure, or as directed by your caregiver. Walking may help to reduce any bloated feeling in your abdomen.  Drink enough fluids to keep your urine clear or pale yellow.  You may gargle with salt water if you have a sore throat. SEEK IMMEDIATE MEDICAL CARE IF:  You have severe nausea or vomiting.  You have severe abdominal pain, abdominal cramps that last longer than 6 hours, or abdominal swelling (distention).  You have severe shoulder or back pain.  You have trouble swallowing.  You have shortness of breath, your breathing is shallow, or you are breathing faster than normal.  You have a fever or a rapid heartbeat.  You vomit  blood or material that looks like coffee grounds.  You have bloody, black, or tarry stools. MAKE SURE YOU:  Understand these instructions.  Will watch your condition.  Will get help right away if you are not doing well or get worse.   This information is not intended to replace advice given to you by your health care provider. Make sure you discuss any questions you have with your health care provider.   Document Released: 07/01/2004 Document Revised: 12/08/2014 Document Reviewed: 02/17/2012 Elsevier Interactive Patient Education [employer].

## 2015-10-01 ENCOUNTER — Encounter (HOSPITAL_COMMUNITY): Payer: Self-pay | Admitting: Internal Medicine

## 2015-10-01 ENCOUNTER — Telehealth (INDEPENDENT_AMBULATORY_CARE_PROVIDER_SITE_OTHER): Payer: Self-pay | Admitting: Internal Medicine

## 2015-10-01 MED ORDER — BIS SUBCIT-METRONID-TETRACYC 140-125-125 MG PO CAPS: 3.0000 | ORAL_CAPSULE | Freq: Three times a day (TID) | ORAL | Status: DC

## 2015-10-01 NOTE — Telephone Encounter (Signed)
Gastric biopsy shows H. pylori infection. Results reviewed with patient. Prescription for Pylera sent to patient's pharmacy.

## 2015-10-02 ENCOUNTER — Encounter (INDEPENDENT_AMBULATORY_CARE_PROVIDER_SITE_OTHER): Payer: Self-pay | Admitting: Internal Medicine

## 2015-10-03 ENCOUNTER — Encounter (INDEPENDENT_AMBULATORY_CARE_PROVIDER_SITE_OTHER): Payer: Self-pay | Admitting: Internal Medicine

## 2015-10-03 ENCOUNTER — Encounter (HOSPITAL_COMMUNITY): Payer: Self-pay | Admitting: Internal Medicine

## 2015-10-05 ENCOUNTER — Encounter (INDEPENDENT_AMBULATORY_CARE_PROVIDER_SITE_OTHER): Payer: Self-pay

## 2015-10-11 ENCOUNTER — Telehealth (INDEPENDENT_AMBULATORY_CARE_PROVIDER_SITE_OTHER): Payer: Self-pay | Admitting: *Deleted

## 2015-10-11 NOTE — Telephone Encounter (Signed)
Misty Mcdowell finished her antibiotic today and was wanting to know if she should go on a probiotic now. The return phone number is [phone].

## 2015-10-11 NOTE — Telephone Encounter (Signed)
I advised her since she is having normal stools just to wait.

## 2015-10-23 ENCOUNTER — Ambulatory Visit (INDEPENDENT_AMBULATORY_CARE_PROVIDER_SITE_OTHER): Payer: BLUE CROSS/BLUE SHIELD | Admitting: Internal Medicine

## 2015-10-23 ENCOUNTER — Encounter (INDEPENDENT_AMBULATORY_CARE_PROVIDER_SITE_OTHER): Payer: Self-pay | Admitting: Internal Medicine

## 2015-10-23 VITALS — BP 108/62 | HR 64 | Temp 98.0°F | Ht 62.5 in | Wt 129.4 lb

## 2015-10-23 DIAGNOSIS — K2970 Gastritis, unspecified, without bleeding: Secondary | ICD-10-CM | POA: Diagnosis not present

## 2015-10-23 DIAGNOSIS — B9681 Helicobacter pylori [H. pylori] as the cause of diseases classified elsewhere: Secondary | ICD-10-CM

## 2015-10-23 DIAGNOSIS — R1013 Epigastric pain: Secondary | ICD-10-CM

## 2015-10-23 DIAGNOSIS — A048 Other specified bacterial intestinal infections: Secondary | ICD-10-CM | POA: Insufficient documentation

## 2015-10-23 MED ORDER — OMEPRAZOLE 20 MG PO CPDR: 20.0000 mg | DELAYED_RELEASE_CAPSULE | Freq: Every day | ORAL | Status: DC

## 2015-10-23 NOTE — Patient Instructions (Signed)
Omeprazole 30 minutes before breakfast and 30 minutes before supper.

## 2015-10-23 NOTE — Progress Notes (Addendum)
   Subjective:    Patient ID: Misty Mcdowell, female    DOB: December 09, 1965, 49 y.o.   MRN: [id number]  HPIHere today for f/u after recent EGD in October for epigatric pain and postprandial bloating and nausea. No hx of melena.  On EGD she was found to have H. Pylori gastritis and was given an Rx for Pylera. She presents today for f/u. She tells me she feels the same. She is taking the omeprazole. Her symptoms are worse when she eats or on a full stomach. Appetite is good. No weight loss. Usually has a BM daily. She usues Linzess daily.      09/26/2015: EGD  Indications: Patient is 49 year old Caucasian female who presents with 6-7 month history of epigastric pain postprandial bloating nausea and she's had 2 episodes of emesis. No history of melena. She has not responded to PPI therapy. She had upper abdominal ultrasound by Dr. Consuello Masse and was negative for cholelithiasis or other abnormalities. Patient's LFTs were normal. She has not responded to PPI therapy. She is undergoing diagnostic EGD. Impression: Small sliding hiatal hernia. Mild portal gastropathy. Biopsy taken from mucosa of gastric body for routine histology.  Patient has H. pylori gastritis and prescription for Pylera sent to her  pharmacy. She needs office visit in one month. Report to PCP   Review of Systems Past Medical History  Diagnosis Date  . Anemia   . Scoliosis     Past Surgical History  Procedure Laterality Date  . Tubal ligation    . Colonoscopy  06/26/2011    Procedure: COLONOSCOPY;  Surgeon: Rogene Houston, MD;  Location: [facility];  Service: Endoscopy;  Laterality: N/A;  . Esophagogastroduodenoscopy N/A 09/26/2015    Procedure: ESOPHAGOGASTRODUODENOSCOPY (EGD);  Surgeon: Rogene Houston, MD;  Location: [facility];  Service: Endoscopy;  Laterality: N/A;  3:00     No Known  Allergies  Current Outpatient Prescriptions on File Prior to Visit  Medication Sig Dispense Refill  . cholecalciferol (VITAMIN D) 1000 UNITS tablet Take 1,000 Units by mouth daily.    Marland Kitchen dicyclomine (BENTYL) 10 MG capsule Take 1 capsule (10 mg total) by mouth 3 (three) times daily as needed for spasms. 60 capsule 1  . Fish Oil-Cholecalciferol (FISH OIL + D3 PO) Take by mouth.    . Linaclotide (LINZESS) 145 MCG CAPS capsule Take 145 mcg by mouth daily.     No current facility-administered medications on file prior to visit.        Objective:   Physical Exam Blood pressure 108/62, pulse 64, temperature 98 F (36.7 C), height 5' 2.5" (1.588 m), weight 129 lb 6.4 oz (58.695 kg). Alert and oriented. Skin warm and dry. Oral mucosa is moist.   . Sclera anicteric, conjunctivae is pink. Thyroid not enlarged. No cervical lymphadenopathy. Lungs clear. Heart regular rate and rhythm.  Abdomen is soft. Bowel sounds are positive. No hepatomegaly. No abdominal masses felt. No tenderness.  No edema to lower extremities.         Assessment & Plan:  H. Pylori gastris. She feels better. She still has some bloating after eating or eating. Am going to increase her Omeprazole 20mg  to BID and she will call with a PR in 2 weeks. OV in 1 year.

## 2015-11-15 ENCOUNTER — Telehealth (INDEPENDENT_AMBULATORY_CARE_PROVIDER_SITE_OTHER): Payer: Self-pay | Admitting: *Deleted

## 2015-11-15 NOTE — Telephone Encounter (Signed)
She was to call with a progress report.  No change   You can call her at [phone]

## 2015-11-20 NOTE — Telephone Encounter (Signed)
She tells me she feels better. She does feel some better. She takes Dicyclomine as needed She needs to take the Dicyclomine 30 minutes before she eats.

## 2016-01-24 ENCOUNTER — Ambulatory Visit: Payer: BLUE CROSS/BLUE SHIELD

## 2016-01-24 ENCOUNTER — Encounter: Payer: Self-pay | Admitting: Podiatry

## 2016-01-24 ENCOUNTER — Ambulatory Visit (INDEPENDENT_AMBULATORY_CARE_PROVIDER_SITE_OTHER): Payer: BLUE CROSS/BLUE SHIELD | Admitting: Podiatry

## 2016-01-24 VITALS — BP 121/76 | HR 89 | Resp 12

## 2016-01-24 DIAGNOSIS — B351 Tinea unguium: Secondary | ICD-10-CM

## 2016-01-24 NOTE — Progress Notes (Signed)
   Subjective:    Patient ID: Misty Mcdowell, female    DOB: [birth year], 50 y.o.   MRN: [id number]  HPI Pt presents with thick discolored nails 2 and 3 left foot   Review of Systems    all other systems negative Objective:   Physical Exam  Thick discolored nails, onychomycosis nails 2,3       Assessment & Plan:  Laser administered today, approximately 1500 pulses delivered to left 2,3 nails, plus prophylactic treatment to left 1st, 4th and right 1st. All safety precautions in place. Pt tolerated well, no pain. Recommended formula 3 topical to affected nails only. Re-appointed in 1 month for laser re-treat

## 2016-02-21 ENCOUNTER — Other Ambulatory Visit: Payer: BLUE CROSS/BLUE SHIELD

## 2016-03-06 ENCOUNTER — Ambulatory Visit (INDEPENDENT_AMBULATORY_CARE_PROVIDER_SITE_OTHER): Payer: BLUE CROSS/BLUE SHIELD

## 2016-03-06 DIAGNOSIS — B351 Tinea unguium: Secondary | ICD-10-CM

## 2016-03-06 NOTE — Progress Notes (Signed)
   Subjective:    Patient ID: Misty Mcdowell, female    DOB: 12/10/1965, 49 y.o.   MRN: [id number]  HPI  Pt presents today for laser therapy #2 right 1st, left 1st,2nd, 3rd  Review of Systems    All other systems negative Objective:   Physical Exam Onychomycosis nails 2,3 left with minimal clearing       Assessment & Plan:  Debridement of nails and laser therapy administered with all safety precautions in place at approximately 1500 pulses. Re-appointed in 1 month for re-treatment

## 2016-03-26 DIAGNOSIS — M4125 Other idiopathic scoliosis, thoracolumbar region: Secondary | ICD-10-CM | POA: Diagnosis not present

## 2016-03-26 DIAGNOSIS — S335XXA Sprain of ligaments of lumbar spine, initial encounter: Secondary | ICD-10-CM | POA: Diagnosis not present

## 2016-03-26 DIAGNOSIS — S134XXA Sprain of ligaments of cervical spine, initial encounter: Secondary | ICD-10-CM | POA: Diagnosis not present

## 2016-04-03 ENCOUNTER — Other Ambulatory Visit: Payer: BLUE CROSS/BLUE SHIELD

## 2016-04-07 ENCOUNTER — Ambulatory Visit (INDEPENDENT_AMBULATORY_CARE_PROVIDER_SITE_OTHER): Payer: BLUE CROSS/BLUE SHIELD

## 2016-04-07 DIAGNOSIS — D259 Leiomyoma of uterus, unspecified: Secondary | ICD-10-CM | POA: Diagnosis not present

## 2016-04-07 DIAGNOSIS — N939 Abnormal uterine and vaginal bleeding, unspecified: Secondary | ICD-10-CM | POA: Diagnosis not present

## 2016-04-07 DIAGNOSIS — B351 Tinea unguium: Secondary | ICD-10-CM

## 2016-04-07 NOTE — Progress Notes (Signed)
   Subjective:    Patient ID: CINDEL LACAYO, female    DOB: Mar 01, 1966, 50 y.o.   MRN: [id number]  HPI  Pt presents today for laser therapy #2 right 1st, left 1st,2nd, 3rd  Review of Systems    All other systems negative Objective:   Physical Exam Onychomycosis nails 2,3 left with minimal clearing       Assessment & Plan:  Debridement of nails and laser therapy administered with all safety precautions in place at approximately 1500 pulses. Pictures of nails were taken to monitor progress. Re-appointed in 3 months for re-treatment

## 2016-04-14 DIAGNOSIS — N939 Abnormal uterine and vaginal bleeding, unspecified: Secondary | ICD-10-CM | POA: Diagnosis not present

## 2016-04-14 DIAGNOSIS — Z30430 Encounter for insertion of intrauterine contraceptive device: Secondary | ICD-10-CM | POA: Diagnosis not present

## 2016-04-18 DIAGNOSIS — Z23 Encounter for immunization: Secondary | ICD-10-CM | POA: Diagnosis not present

## 2016-04-22 DIAGNOSIS — S335XXA Sprain of ligaments of lumbar spine, initial encounter: Secondary | ICD-10-CM | POA: Diagnosis not present

## 2016-04-22 DIAGNOSIS — M4125 Other idiopathic scoliosis, thoracolumbar region: Secondary | ICD-10-CM | POA: Diagnosis not present

## 2016-04-22 DIAGNOSIS — S134XXA Sprain of ligaments of cervical spine, initial encounter: Secondary | ICD-10-CM | POA: Diagnosis not present

## 2016-05-16 DIAGNOSIS — D259 Leiomyoma of uterus, unspecified: Secondary | ICD-10-CM | POA: Diagnosis not present

## 2016-05-16 DIAGNOSIS — N841 Polyp of cervix uteri: Secondary | ICD-10-CM | POA: Diagnosis not present

## 2016-05-16 DIAGNOSIS — Z30430 Encounter for insertion of intrauterine contraceptive device: Secondary | ICD-10-CM | POA: Diagnosis not present

## 2016-05-16 DIAGNOSIS — Z9851 Tubal ligation status: Secondary | ICD-10-CM | POA: Diagnosis not present

## 2016-05-16 DIAGNOSIS — N920 Excessive and frequent menstruation with regular cycle: Secondary | ICD-10-CM | POA: Diagnosis not present

## 2016-05-16 DIAGNOSIS — G43829 Menstrual migraine, not intractable, without status migrainosus: Secondary | ICD-10-CM | POA: Diagnosis not present

## 2016-05-16 DIAGNOSIS — Z87891 Personal history of nicotine dependence: Secondary | ICD-10-CM | POA: Diagnosis not present

## 2016-05-16 DIAGNOSIS — N939 Abnormal uterine and vaginal bleeding, unspecified: Secondary | ICD-10-CM | POA: Diagnosis not present

## 2016-05-16 DIAGNOSIS — Z800 Family history of malignant neoplasm of digestive organs: Secondary | ICD-10-CM | POA: Diagnosis not present

## 2016-05-16 DIAGNOSIS — N938 Other specified abnormal uterine and vaginal bleeding: Secondary | ICD-10-CM | POA: Diagnosis not present

## 2016-05-19 DIAGNOSIS — S335XXA Sprain of ligaments of lumbar spine, initial encounter: Secondary | ICD-10-CM | POA: Diagnosis not present

## 2016-05-19 DIAGNOSIS — S134XXA Sprain of ligaments of cervical spine, initial encounter: Secondary | ICD-10-CM | POA: Diagnosis not present

## 2016-05-19 DIAGNOSIS — M4125 Other idiopathic scoliosis, thoracolumbar region: Secondary | ICD-10-CM | POA: Diagnosis not present

## 2016-06-06 DIAGNOSIS — Z1231 Encounter for screening mammogram for malignant neoplasm of breast: Secondary | ICD-10-CM | POA: Diagnosis not present

## 2016-06-11 ENCOUNTER — Telehealth (INDEPENDENT_AMBULATORY_CARE_PROVIDER_SITE_OTHER): Payer: Self-pay | Admitting: *Deleted

## 2016-06-11 ENCOUNTER — Encounter (INDEPENDENT_AMBULATORY_CARE_PROVIDER_SITE_OTHER): Payer: Self-pay | Admitting: *Deleted

## 2016-06-11 ENCOUNTER — Other Ambulatory Visit (INDEPENDENT_AMBULATORY_CARE_PROVIDER_SITE_OTHER): Payer: Self-pay | Admitting: *Deleted

## 2016-06-11 ENCOUNTER — Other Ambulatory Visit (INDEPENDENT_AMBULATORY_CARE_PROVIDER_SITE_OTHER): Payer: Self-pay | Admitting: Internal Medicine

## 2016-06-11 DIAGNOSIS — Z800 Family history of malignant neoplasm of digestive organs: Secondary | ICD-10-CM

## 2016-06-11 NOTE — Telephone Encounter (Signed)
Patient needs trilyte 

## 2016-06-11 NOTE — Telephone Encounter (Signed)
agree

## 2016-06-11 NOTE — Telephone Encounter (Signed)
Referring MD/PCP: sasser   Procedure: tcs  Reason/Indication:  fam hx colon ca  Has patient had this procedure before?  Yes, 2012 -- epic  If so, when, by whom and where?    Is there a family history of colon cancer?  Yes, father  Who?  What age when diagnosed?    Is patient diabetic?   no      Does patient have prosthetic heart valve or mechanical valve?  no  Do you have a pacemaker?  no  Has patient ever had endocarditis? no  Has patient had joint replacement within last 12 months?  no  Does patient tend to be constipated or take laxatives? yes  Does patient have a history of alcohol/drug use?  no  Is patient on Coumadin, Plavix and/or Aspirin? no  Medications: see epic  Allergies: nkda  Medication Adjustment:   Procedure date & time: 07/03/16

## 2016-06-12 MED ORDER — PEG 3350-KCL-NA BICARB-NACL 420 G PO SOLR: 4000.0000 mL | Freq: Once | ORAL | Status: DC

## 2016-06-16 DIAGNOSIS — S134XXA Sprain of ligaments of cervical spine, initial encounter: Secondary | ICD-10-CM | POA: Diagnosis not present

## 2016-06-16 DIAGNOSIS — M4125 Other idiopathic scoliosis, thoracolumbar region: Secondary | ICD-10-CM | POA: Diagnosis not present

## 2016-06-16 DIAGNOSIS — S335XXA Sprain of ligaments of lumbar spine, initial encounter: Secondary | ICD-10-CM | POA: Diagnosis not present

## 2016-06-20 DIAGNOSIS — S134XXA Sprain of ligaments of cervical spine, initial encounter: Secondary | ICD-10-CM | POA: Diagnosis not present

## 2016-06-20 DIAGNOSIS — S335XXA Sprain of ligaments of lumbar spine, initial encounter: Secondary | ICD-10-CM | POA: Diagnosis not present

## 2016-06-20 DIAGNOSIS — M4125 Other idiopathic scoliosis, thoracolumbar region: Secondary | ICD-10-CM | POA: Diagnosis not present

## 2016-07-03 ENCOUNTER — Other Ambulatory Visit: Payer: BLUE CROSS/BLUE SHIELD

## 2016-07-03 ENCOUNTER — Encounter (HOSPITAL_COMMUNITY): Payer: Self-pay | Admitting: *Deleted

## 2016-07-03 ENCOUNTER — Ambulatory Visit (HOSPITAL_COMMUNITY)
Admission: RE | Admit: 2016-07-03 | Discharge: 2016-07-03 | Disposition: A | Discharge status: Home / Self Care | Payer: BLUE CROSS/BLUE SHIELD | Source: Ambulatory Visit | Attending: Internal Medicine | Admitting: Internal Medicine

## 2016-07-03 ENCOUNTER — Encounter (HOSPITAL_COMMUNITY)
Admission: RE | Disposition: A | Discharge status: Home / Self Care | Payer: Self-pay | Source: Ambulatory Visit | Attending: Internal Medicine

## 2016-07-03 DIAGNOSIS — M419 Scoliosis, unspecified: Secondary | ICD-10-CM | POA: Insufficient documentation

## 2016-07-03 DIAGNOSIS — K644 Residual hemorrhoidal skin tags: Secondary | ICD-10-CM | POA: Diagnosis not present

## 2016-07-03 DIAGNOSIS — Z1211 Encounter for screening for malignant neoplasm of colon: Secondary | ICD-10-CM | POA: Insufficient documentation

## 2016-07-03 DIAGNOSIS — Z79899 Other long term (current) drug therapy: Secondary | ICD-10-CM | POA: Diagnosis not present

## 2016-07-03 DIAGNOSIS — Z800 Family history of malignant neoplasm of digestive organs: Secondary | ICD-10-CM | POA: Insufficient documentation

## 2016-07-03 DIAGNOSIS — D120 Benign neoplasm of cecum: Secondary | ICD-10-CM | POA: Diagnosis not present

## 2016-07-03 DIAGNOSIS — Z862 Personal history of diseases of the blood and blood-forming organs and certain disorders involving the immune mechanism: Secondary | ICD-10-CM | POA: Insufficient documentation

## 2016-07-03 HISTORY — PX: COLONOSCOPY: SHX5424

## 2016-07-03 SURGERY — COLONOSCOPY
Anesthesia: Moderate Sedation

## 2016-07-03 MED ORDER — MIDAZOLAM HCL 5 MG/5ML IJ SOLN
INTRAMUSCULAR | Status: DC | PRN
  Administered 2016-07-03: 1 mg via INTRAVENOUS
  Administered 2016-07-03 (×2): 2 mg via INTRAVENOUS
  Administered 2016-07-03 (×2): 1 mg via INTRAVENOUS

## 2016-07-03 MED ORDER — MIDAZOLAM HCL 5 MG/5ML IJ SOLN
INTRAMUSCULAR | Status: AC
  Filled 2016-07-03: qty 10

## 2016-07-03 MED ORDER — SODIUM CHLORIDE 0.9 % IV SOLN
INTRAVENOUS | Status: DC
  Administered 2016-07-03: 1000 mL via INTRAVENOUS

## 2016-07-03 MED ORDER — MEPERIDINE HCL 50 MG/ML IJ SOLN
INTRAMUSCULAR | Status: DC | PRN
  Administered 2016-07-03 (×2): 25 mg via INTRAVENOUS

## 2016-07-03 MED ORDER — SIMETHICONE 40 MG/0.6ML PO SUSP
ORAL | Status: DC | PRN
  Administered 2016-07-03: 09:00:00

## 2016-07-03 MED ORDER — MEPERIDINE HCL 50 MG/ML IJ SOLN
INTRAMUSCULAR | Status: AC
  Filled 2016-07-03: qty 1

## 2016-07-03 NOTE — Op Note (Signed)
[facility] Patient Name: Misty Mcdowell Procedure Date: 07/03/2016 9:10 AM MRN: [id number] Date of Birth: 06-29-1966 Attending MD: Hildred Laser , MD CSN: [id number] Age: 50 Admit Type: Outpatient Procedure:                Colonoscopy Indications:              Screening in patient at increased risk: Colorectal                            cancer in father 60 or older Providers:                Hildred Laser, MD, Otis Peak B. Sharon Seller, RN, Shelby Mattocks, Technician Referring MD:             Manon Hilding MD, MD Medicines:                Meperidine 50 mg IV, Midazolam 7 mg IV Complications:            No immediate complications. Estimated Blood Loss:     Estimated blood loss: none. Procedure:                Pre-Anesthesia Assessment:                           - Prior to the procedure, a History and Physical                            was performed, and patient medications and                            allergies were reviewed. The patient's tolerance of                            previous anesthesia was also reviewed. The risks                            and benefits of the procedure and the sedation                            options and risks were discussed with the patient.                            All questions were answered, and informed consent                            was obtained. Prior Anticoagulants: The patient has                            taken no previous anticoagulant or antiplatelet                            agents. ASA Grade Assessment: I - A normal, healthy  patient. After reviewing the risks and benefits,                            the patient was deemed in satisfactory condition to                            undergo the procedure.                           After obtaining informed consent, the colonoscope                            was passed under direct vision. Throughout the   procedure, the patient's blood pressure, pulse, and                            oxygen saturations were monitored continuously. The                            [id number]) scope was introduced through                            the anus and advanced to the the cecum, identified                            by appendiceal orifice and ileocecal valve. The                            colonoscopy was performed without difficulty. The                            patient tolerated the procedure well. The quality                            of the bowel preparation was good. The ileocecal                            valve, appendiceal orifice, and rectum were                            photographed. Scope In: 9:31:39 AM Scope Out: 9:55:22 AM Scope Withdrawal Time: 0 hours 11 minutes 32 seconds  Total Procedure Duration: 0 hours 23 minutes 43 seconds  Findings:      A 6 to 12 mm polyp was found in the cecum. The polyp was semi-sessile.       The polyp was removed with a hot snare. Resection and retrieval were       complete. To prevent bleeding after the polypectomy, one hemostatic clip       was successfully placed (MR conditional). There was no bleeding at the       end of the procedure.      The exam was otherwise without abnormality.      External hemorrhoids were found during retroflexion. The hemorrhoids       were small. Impression:               -  One 6 to 12 mm polyp in the cecum, removed with a                            hot snare. Resected and retrieved. Clip (MR                            conditional) was placed.                           - The examination was otherwise normal.                           - External hemorrhoids. Moderate Sedation:      Moderate (conscious) sedation was administered by the endoscopy nurse       and supervised by the endoscopist. The following parameters were       monitored: oxygen saturation, heart rate, blood pressure, CO2       capnography and  response to care. Total physician intraservice time was       29 minutes. Recommendation:           - Patient has a contact number available for                            emergencies. The signs and symptoms of potential                            delayed complications were discussed with the                            patient. Return to normal activities tomorrow.                            Written discharge instructions were provided to the                            patient.                           - Resume previous diet today.                           - Continue present medications.                           - No aspirin, ibuprofen, naproxen, or other                            non-steroidal anti-inflammatory drugs for 7 days                            after polyp removal.                           - Await pathology results.                           -  Repeat colonoscopy for surveillance based on                            pathology results. Procedure Code(s):        --- Professional ---                           [id number], Colonoscopy, flexible; with removal of                            tumor(s), polyp(s), or other lesion(s) by snare                            technique                           99152, Moderate sedation services provided by the                            same physician or other qualified health care                            professional performing the diagnostic or                            therapeutic service that the sedation supports,                            requiring the presence of an independent trained                            observer to assist in the monitoring of the                            patient's level of consciousness and physiological                            status; initial 15 minutes of intraservice time,                            patient age 86 years or older                           [phone], Moderate sedation services; each additional                             15 minutes intraservice time Diagnosis Code(s):        --- Professional ---                           Z80.0, Family history of malignant neoplasm of                            digestive organs  D12.0, Benign neoplasm of cecum                           K64.4, Residual hemorrhoidal skin tags CPT copyright 2016 American [employer] Association. All rights reserved. The codes documented in this report are preliminary and upon coder review may  be revised to meet current compliance requirements. Hildred Laser, MD Hildred Laser, MD 07/03/2016 10:02:21 AM This report has been signed electronically. Number of Addenda: 0

## 2016-07-03 NOTE — Discharge Instructions (Signed)
No aspirin or NSAIDs for 1 week. °Resume usual medications and diet. °No driving for 24 hours. °Physician will call with biopsy results. ° ° ° ° °Colonoscopy, Care After °These instructions give you information on caring for yourself after your procedure. Your doctor may also give you more specific instructions. Call your doctor if you have any problems or questions after your procedure. °HOME CARE °· Do not drive for 24 hours. °· Do not sign important papers or use machinery for 24 hours. °· You may shower. °· You may go back to your usual activities, but go slower for the first 24 hours. °· Take rest breaks often during the first 24 hours. °· Walk around or use warm packs on your belly (abdomen) if you have belly cramping or gas. °· Drink enough fluids to keep your pee (urine) clear or pale yellow. °· Resume your normal diet. Avoid heavy or fried foods. °· Avoid drinking alcohol for 24 hours or as told by your doctor. °· Only take medicines as told by your doctor. °If a tissue sample (biopsy) was taken during the procedure:  °· Do not take aspirin or blood thinners for 7 days, or as told by your doctor. °· Do not drink alcohol for 7 days, or as told by your doctor. °· Eat soft foods for the first 24 hours. °GET HELP IF: °You still have a small amount of blood in your poop (stool) 2-3 days after the procedure. °GET HELP RIGHT AWAY IF: °· You have more than a small amount of blood in your poop. °· You see clumps of tissue (blood clots) in your poop. °· Your belly is puffy (swollen). °· You feel sick to your stomach (nauseous) or throw up (vomit). °· You have a fever. °· You have belly pain that gets worse and medicine does not help. °MAKE SURE YOU: °· Understand these instructions. °· Will watch your condition. °· Will get help right away if you are not doing well or get worse. °  °This information is not intended to replace advice given to you by your health care provider. Make sure you discuss any questions you  have with your health care provider. °  °Document Released: 12/20/2010 Document Revised: 11/22/2013 Document Reviewed: 07/25/2013 °Elsevier Interactive Patient Education ©2016 Else[employer] ° ° °Colon Polyps °Polyps are lumps of extra tissue growing inside the body. Polyps can grow in the large intestine (colon). Most colon polyps are noncancerous (benign). However, some colon polyps can become cancerous over time. Polyps that are larger than a pea may be harmful. To be safe, caregivers remove and test all polyps. °CAUSES  °Polyps form when mutations in the genes cause your cells to grow and divide even though no more tissue is needed. °RISK FACTORS °There are a number of risk factors that can increase your chances of getting colon polyps. They include: °· Being older than 50 years. °· Family history of colon polyps or colon cancer. °· Long-term colon diseases, such as colitis or Crohn disease. °· Being overweight. °· Smoking. °· Being inactive. °· Drinking too much alcohol. °SYMPTOMS  °Most small polyps do not cause symptoms. If symptoms are present, they may include: °· Blood in the stool. The stool may look dark red or black. °· Constipation or diarrhea that lasts longer than 1 week. °DIAGNOSIS °People often do not know they have polyps until their caregiver finds them during a regular checkup. Your caregiver can use 4 tests to check for polyps: °· Digital rectal exam.   The caregiver wears gloves and feels inside the rectum. This test would find polyps only in the rectum. °· Barium enema. The caregiver puts a liquid called barium into your rectum before taking X-rays of your colon. Barium makes your colon look white. Polyps are dark, so they are easy to see in the X-ray pictures. °· Sigmoidoscopy. A thin, flexible tube (sigmoidoscope) is placed into your rectum. The sigmoidoscope has a light and tiny camera in it. The caregiver uses the sigmoidoscope to look at the last third of your colon. °· Colonoscopy. This  test is like sigmoidoscopy, but the caregiver looks at the entire colon. This is the most common method for finding and removing polyps. °TREATMENT  °Any polyps will be removed during a sigmoidoscopy or colonoscopy. The polyps are then tested for cancer. °PREVENTION  °To help lower your risk of getting more colon polyps: °· Eat plenty of fruits and vegetables. Avoid eating fatty foods. °· Do not smoke. °· Avoid drinking alcohol. °· Exercise every day. °· Lose weight if recommended by your caregiver. °· Eat plenty of calcium and folate. Foods that are rich in calcium include milk, cheese, and broccoli. Foods that are rich in folate include chickpeas, kidney beans, and spinach. °HOME CARE INSTRUCTIONS °Keep all follow-up appointments as directed by your caregiver. You may need periodic exams to check for polyps. °SEEK MEDICAL CARE IF: °You notice bleeding during a bowel movement. °  °This information is not intended to replace advice given to you by your health care provider. Make sure you discuss any questions you have with your health care provider. °  °Document Released: 08/13/2004 Document Revised: 12/08/2014 Document Reviewed: 01/27/2012 °Elsevier Interactive Patient Education ©2016 Elsevier Inc. ° °

## 2016-07-03 NOTE — H&P (Signed)
Misty Mcdowell is an 50 y.o. female.   Chief Complaint: Patient is here for colonoscopy. HPI: Patient is 50 year old Caucasian female who is here for high-risk screening colonoscopy. She denies abdominal pain change in bowel habits or rectal bleeding. Family history significant for CRC and father was diagnosed at age 20 and died of metastatic disease 4 years later. Family history is also significant for colonic polyps in mother. Patient's last exam was in July 2012 with removal of small polyp and was not an adenoma.  Past Medical History:  Diagnosis Date  . Anemia   . Scoliosis     Past Surgical History:  Procedure Laterality Date  . COLONOSCOPY  06/26/2011   Procedure: COLONOSCOPY;  Surgeon: Rogene Houston, MD;  Location: [facility];  Service: Endoscopy;  Laterality: N/A;  . ESOPHAGOGASTRODUODENOSCOPY N/A 09/26/2015   Procedure: ESOPHAGOGASTRODUODENOSCOPY (EGD);  Surgeon: Rogene Houston, MD;  Location: [facility];  Service: Endoscopy;  Laterality: N/A;  3:00   . TUBAL LIGATION      Family History  Problem Relation Age of Onset  . Hypertension Mother   . Colon cancer Father   . Lung cancer Paternal Grandmother    Social History:  reports that she has never smoked. She has never used smokeless tobacco. She reports that she drinks alcohol. She reports that she does not use drugs.  Allergies: No Known Allergies  Medications Prior to Admission  Medication Sig Dispense Refill  . Linaclotide (LINZESS) 145 MCG CAPS capsule Take 145 mcg by mouth daily.    . polyethylene glycol-electrolytes (TRILYTE) 420 g solution Take 4,000 mLs by mouth once. 4000 mL 0  . cholecalciferol (VITAMIN D) 1000 UNITS tablet Take 1,000 Units by mouth daily.    Marland Kitchen dicyclomine (BENTYL) 10 MG capsule Take 1 capsule (10 mg total) by mouth 3 (three) times daily as needed for spasms. 60 capsule 1  . Fish Oil-Cholecalciferol (FISH OIL + D3 PO) Take by mouth.    Marland Kitchen omeprazole (PRILOSEC) 20 MG capsule Take 1  capsule (20 mg total) by mouth daily. 60 capsule 1    No results found for this or any previous visit (from the past 48 hour(s)). No results found.  ROS  Blood pressure 120/83, pulse 66, temperature 98.9 F (37.2 C), temperature source Oral, resp. rate 19, height 5' 2.5" (1.588 m), weight 134 lb (60.8 kg), last menstrual period 06/26/2016, SpO2 99 %. Physical Exam  Constitutional: She appears well-developed and well-nourished.  HENT:  Mouth/Throat: Oropharynx is clear and moist.  Eyes: Conjunctivae are normal. No scleral icterus.  Neck: No thyromegaly present.  Cardiovascular: Normal rate, regular rhythm and normal heart sounds.   No murmur heard. Respiratory: Effort normal and breath sounds normal.  GI: Soft. She exhibits no distension and no mass. There is no tenderness.  Musculoskeletal: She exhibits no edema.  Lymphadenopathy:    She has no cervical adenopathy.  Neurological: She is alert.  Skin: Skin is warm and dry.     Assessment/Plan High risk screening colonoscopy.   Hildred Laser, MD 07/03/2016, 9:22 AM

## 2016-07-07 ENCOUNTER — Other Ambulatory Visit (INDEPENDENT_AMBULATORY_CARE_PROVIDER_SITE_OTHER): Payer: Self-pay | Admitting: Internal Medicine

## 2016-07-07 MED ORDER — LINACLOTIDE 145 MCG PO CAPS: 145.0000 ug | ORAL_CAPSULE | Freq: Every day | ORAL | 3 refills | Status: DC

## 2016-07-10 ENCOUNTER — Other Ambulatory Visit: Payer: BLUE CROSS/BLUE SHIELD

## 2016-07-10 ENCOUNTER — Encounter (HOSPITAL_COMMUNITY): Payer: Self-pay | Admitting: Internal Medicine

## 2016-07-14 DIAGNOSIS — S134XXA Sprain of ligaments of cervical spine, initial encounter: Secondary | ICD-10-CM | POA: Diagnosis not present

## 2016-07-14 DIAGNOSIS — M4125 Other idiopathic scoliosis, thoracolumbar region: Secondary | ICD-10-CM | POA: Diagnosis not present

## 2016-07-14 DIAGNOSIS — S335XXA Sprain of ligaments of lumbar spine, initial encounter: Secondary | ICD-10-CM | POA: Diagnosis not present

## 2016-07-16 DIAGNOSIS — Z6824 Body mass index (BMI) 24.0-24.9, adult: Secondary | ICD-10-CM | POA: Diagnosis not present

## 2016-07-16 DIAGNOSIS — Z01419 Encounter for gynecological examination (general) (routine) without abnormal findings: Secondary | ICD-10-CM | POA: Diagnosis not present

## 2016-07-17 ENCOUNTER — Encounter (INDEPENDENT_AMBULATORY_CARE_PROVIDER_SITE_OTHER): Payer: Self-pay | Admitting: Internal Medicine

## 2016-07-21 ENCOUNTER — Ambulatory Visit (INDEPENDENT_AMBULATORY_CARE_PROVIDER_SITE_OTHER): Payer: BLUE CROSS/BLUE SHIELD

## 2016-07-21 DIAGNOSIS — B351 Tinea unguium: Secondary | ICD-10-CM

## 2016-07-21 NOTE — Progress Notes (Signed)
   Subjective:    Patient ID: Misty Mcdowell, female    DOB: 1966/07/17, 50 y.o.   MRN: [id number]  HPI  Pt presents today for laser therapy #4 right 1st, left 1st,2nd, 3rd  Review of Systems    All other systems negative Objective:   Physical Exam Onychomycosis nails 2,3 left with minimal clearing       Assessment & Plan:  Debridement of nails and laser therapy administered with all safety precautions in place at approximately 1500 pulses. Re-appointed in 3 months for 5th treatment

## 2016-08-11 DIAGNOSIS — S335XXA Sprain of ligaments of lumbar spine, initial encounter: Secondary | ICD-10-CM | POA: Diagnosis not present

## 2016-08-11 DIAGNOSIS — M4125 Other idiopathic scoliosis, thoracolumbar region: Secondary | ICD-10-CM | POA: Diagnosis not present

## 2016-08-11 DIAGNOSIS — S134XXA Sprain of ligaments of cervical spine, initial encounter: Secondary | ICD-10-CM | POA: Diagnosis not present

## 2016-09-10 DIAGNOSIS — S335XXA Sprain of ligaments of lumbar spine, initial encounter: Secondary | ICD-10-CM | POA: Diagnosis not present

## 2016-09-10 DIAGNOSIS — S134XXA Sprain of ligaments of cervical spine, initial encounter: Secondary | ICD-10-CM | POA: Diagnosis not present

## 2016-09-10 DIAGNOSIS — M4125 Other idiopathic scoliosis, thoracolumbar region: Secondary | ICD-10-CM | POA: Diagnosis not present

## 2016-10-15 DIAGNOSIS — M4125 Other idiopathic scoliosis, thoracolumbar region: Secondary | ICD-10-CM | POA: Diagnosis not present

## 2016-10-15 DIAGNOSIS — S335XXA Sprain of ligaments of lumbar spine, initial encounter: Secondary | ICD-10-CM | POA: Diagnosis not present

## 2016-10-15 DIAGNOSIS — S134XXA Sprain of ligaments of cervical spine, initial encounter: Secondary | ICD-10-CM | POA: Diagnosis not present

## 2016-10-27 ENCOUNTER — Ambulatory Visit (INDEPENDENT_AMBULATORY_CARE_PROVIDER_SITE_OTHER): Payer: BLUE CROSS/BLUE SHIELD | Admitting: Internal Medicine

## 2016-10-27 ENCOUNTER — Encounter (INDEPENDENT_AMBULATORY_CARE_PROVIDER_SITE_OTHER): Payer: Self-pay | Admitting: Internal Medicine

## 2016-10-27 VITALS — BP 94/50 | HR 72 | Temp 98.4°F | Ht 62.5 in | Wt 139.4 lb

## 2016-10-27 DIAGNOSIS — R140 Abdominal distension (gaseous): Secondary | ICD-10-CM

## 2016-10-27 NOTE — Progress Notes (Signed)
   Subjective:    Patient ID: Misty Mcdowell, female    DOB: 07-09-1966, 50 y.o.   MRN: [id number]  HPI Here today for f/u. She tells me she is bloating.  She says she has fibroid tumors.  She stopped the Omeprazole.  She tells me she bloats after she eats.  Her appetite is good. No weight loss. She has a BM daily with the Linzess. She says she has diarrhe. 07/02/2016 Colonoscopy: Family hx of colon cancer. Impression:               - One 6 to 12 mm polyp in the cecum, removed with a                            hot snare. Resected and retrieved. Clip (MR                            conditional) was placed.                           - The examination was otherwise normal.                           - External hemorrhoids.  Polyp was a tubular adenoma. Results reviewed with patient. Next colonoscopy in 5 years. Review of Systems Past Medical History:  Diagnosis Date  . Anemia   . Scoliosis     Past Surgical History:  Procedure Laterality Date  . COLONOSCOPY  06/26/2011   Procedure: COLONOSCOPY;  Surgeon: Rogene Houston, MD;  [facility]: [facility];  Service: Endoscopy;  Laterality: N/A;  . COLONOSCOPY N/A 07/03/2016   Procedure: COLONOSCOPY;  Surgeon: Rogene Houston, MD;  [facility]: [facility];  Service: Endoscopy;  Laterality: N/A;  930  . ESOPHAGOGASTRODUODENOSCOPY N/A 09/26/2015   Procedure: ESOPHAGOGASTRODUODENOSCOPY (EGD);  Surgeon: Rogene Houston, MD;  [facility]: [facility];  Service: Endoscopy;  Laterality: N/A;  3:00   . TUBAL LIGATION      No Known Allergies  Current Outpatient Prescriptions on File Prior to Visit  Medication Sig Dispense Refill  . linaclotide (LINZESS) 145 MCG CAPS capsule Take 1 capsule (145 mcg total) by mouth daily. 90 capsule 3   No current facility-administered medications on file prior to visit.        Objective:   Physical Exam  Vitals:   10/27/16 1135  Weight: 139 lb 6.4 oz (63.2 kg)  Height: 5' 2.5" (1.588 m)   Alert and  oriented. Skin warm and dry. Oral mucosa is moist.   . Sclera anicteric, conjunctivae is pink. Thyroid not enlarged. No cervical lymphadenopathy. Lungs clear. Heart regular rate and rhythm.  Abdomen is soft. Bowel sounds are positive. No hepatomegaly. No abdominal masses felt. No tenderness.  No edema to lower extremities.          Assessment & Plan:  Bloating. Samples os FDGard given to patient. OV in one year.

## 2016-10-27 NOTE — Patient Instructions (Addendum)
Samples of FDGARD given to patient O/V in one year.

## 2016-11-03 ENCOUNTER — Ambulatory Visit: Payer: Self-pay

## 2016-11-03 DIAGNOSIS — B351 Tinea unguium: Secondary | ICD-10-CM

## 2016-11-03 NOTE — Progress Notes (Signed)
   Subjective:    Patient ID: Misty Mcdowell, female    DOB: 1966/05/13, 50 y.o.   MRN: [id number]  HPI  Pt presents today for laser therapy #4 right 1st, left 1st,2nd, 3rd  Review of Systems    All other systems negative Objective:   Physical Exam Onychomycosis nails 2,3 left with minimal clearing       Assessment & Plan:  Debridement of nails and laser therapy administered with all safety precautions in place at approximately 1500 pulses. Re-appointed in 3 months for 6th treatment

## 2016-11-10 DIAGNOSIS — M4125 Other idiopathic scoliosis, thoracolumbar region: Secondary | ICD-10-CM | POA: Diagnosis not present

## 2016-11-10 DIAGNOSIS — S335XXA Sprain of ligaments of lumbar spine, initial encounter: Secondary | ICD-10-CM | POA: Diagnosis not present

## 2016-11-10 DIAGNOSIS — S134XXA Sprain of ligaments of cervical spine, initial encounter: Secondary | ICD-10-CM | POA: Diagnosis not present

## 2016-12-02 ENCOUNTER — Ambulatory Visit: Payer: BLUE CROSS/BLUE SHIELD

## 2016-12-08 DIAGNOSIS — S335XXA Sprain of ligaments of lumbar spine, initial encounter: Secondary | ICD-10-CM | POA: Diagnosis not present

## 2016-12-08 DIAGNOSIS — M4125 Other idiopathic scoliosis, thoracolumbar region: Secondary | ICD-10-CM | POA: Diagnosis not present

## 2016-12-08 DIAGNOSIS — S134XXA Sprain of ligaments of cervical spine, initial encounter: Secondary | ICD-10-CM | POA: Diagnosis not present

## 2017-01-05 DIAGNOSIS — S134XXA Sprain of ligaments of cervical spine, initial encounter: Secondary | ICD-10-CM | POA: Diagnosis not present

## 2017-01-05 DIAGNOSIS — M4125 Other idiopathic scoliosis, thoracolumbar region: Secondary | ICD-10-CM | POA: Diagnosis not present

## 2017-01-05 DIAGNOSIS — S335XXA Sprain of ligaments of lumbar spine, initial encounter: Secondary | ICD-10-CM | POA: Diagnosis not present

## 2017-02-02 ENCOUNTER — Ambulatory Visit: Payer: Self-pay | Admitting: Podiatry

## 2017-02-02 DIAGNOSIS — S335XXA Sprain of ligaments of lumbar spine, initial encounter: Secondary | ICD-10-CM | POA: Diagnosis not present

## 2017-02-02 DIAGNOSIS — B351 Tinea unguium: Secondary | ICD-10-CM

## 2017-02-02 DIAGNOSIS — S134XXA Sprain of ligaments of cervical spine, initial encounter: Secondary | ICD-10-CM | POA: Diagnosis not present

## 2017-02-02 DIAGNOSIS — M4125 Other idiopathic scoliosis, thoracolumbar region: Secondary | ICD-10-CM | POA: Diagnosis not present

## 2017-02-04 NOTE — Progress Notes (Signed)
   Subjective:    Patient ID: Misty Mcdowell, female    DOB: 1966/07/14, 51 y.o.   MRN: [id number]  HPI  Pt presents today for laser therapy  right 1st, left 1st,2nd, 3rd  Review of Systems    All other systems negative Objective:   Physical Exam Onychomycosis nails 2,3 left with 70% clearing       Assessment & Plan:  Debridement of nails and laser therapy administered with all safety precautions in place at approximately 1500 pulses. Re-appointed as nned with any acute symptom changes

## 2017-02-13 DIAGNOSIS — M4125 Other idiopathic scoliosis, thoracolumbar region: Secondary | ICD-10-CM | POA: Diagnosis not present

## 2017-02-13 DIAGNOSIS — Z6824 Body mass index (BMI) 24.0-24.9, adult: Secondary | ICD-10-CM | POA: Diagnosis not present

## 2017-02-13 DIAGNOSIS — M542 Cervicalgia: Secondary | ICD-10-CM | POA: Diagnosis not present

## 2017-02-13 DIAGNOSIS — M4185 Other forms of scoliosis, thoracolumbar region: Secondary | ICD-10-CM | POA: Diagnosis not present

## 2017-02-13 DIAGNOSIS — M545 Low back pain: Secondary | ICD-10-CM | POA: Diagnosis not present

## 2017-02-13 DIAGNOSIS — M549 Dorsalgia, unspecified: Secondary | ICD-10-CM | POA: Diagnosis not present

## 2017-02-27 DIAGNOSIS — Z6824 Body mass index (BMI) 24.0-24.9, adult: Secondary | ICD-10-CM | POA: Diagnosis not present

## 2017-02-27 DIAGNOSIS — M419 Scoliosis, unspecified: Secondary | ICD-10-CM | POA: Diagnosis not present

## 2017-02-27 DIAGNOSIS — M549 Dorsalgia, unspecified: Secondary | ICD-10-CM | POA: Diagnosis not present

## 2017-04-03 DIAGNOSIS — M419 Scoliosis, unspecified: Secondary | ICD-10-CM | POA: Diagnosis not present

## 2017-05-13 DIAGNOSIS — S134XXA Sprain of ligaments of cervical spine, initial encounter: Secondary | ICD-10-CM | POA: Diagnosis not present

## 2017-05-13 DIAGNOSIS — S335XXA Sprain of ligaments of lumbar spine, initial encounter: Secondary | ICD-10-CM | POA: Diagnosis not present

## 2017-05-13 DIAGNOSIS — M4125 Other idiopathic scoliosis, thoracolumbar region: Secondary | ICD-10-CM | POA: Diagnosis not present

## 2017-06-08 DIAGNOSIS — M4125 Other idiopathic scoliosis, thoracolumbar region: Secondary | ICD-10-CM | POA: Diagnosis not present

## 2017-06-08 DIAGNOSIS — S134XXA Sprain of ligaments of cervical spine, initial encounter: Secondary | ICD-10-CM | POA: Diagnosis not present

## 2017-06-08 DIAGNOSIS — S335XXA Sprain of ligaments of lumbar spine, initial encounter: Secondary | ICD-10-CM | POA: Diagnosis not present

## 2017-06-15 DIAGNOSIS — Z1231 Encounter for screening mammogram for malignant neoplasm of breast: Secondary | ICD-10-CM | POA: Diagnosis not present

## 2017-07-03 ENCOUNTER — Encounter (INDEPENDENT_AMBULATORY_CARE_PROVIDER_SITE_OTHER): Payer: Self-pay | Admitting: Internal Medicine

## 2017-07-06 DIAGNOSIS — M4125 Other idiopathic scoliosis, thoracolumbar region: Secondary | ICD-10-CM | POA: Diagnosis not present

## 2017-07-06 DIAGNOSIS — S134XXA Sprain of ligaments of cervical spine, initial encounter: Secondary | ICD-10-CM | POA: Diagnosis not present

## 2017-07-06 DIAGNOSIS — S335XXA Sprain of ligaments of lumbar spine, initial encounter: Secondary | ICD-10-CM | POA: Diagnosis not present

## 2017-08-04 DIAGNOSIS — S134XXA Sprain of ligaments of cervical spine, initial encounter: Secondary | ICD-10-CM | POA: Diagnosis not present

## 2017-08-04 DIAGNOSIS — M4125 Other idiopathic scoliosis, thoracolumbar region: Secondary | ICD-10-CM | POA: Diagnosis not present

## 2017-08-04 DIAGNOSIS — S335XXA Sprain of ligaments of lumbar spine, initial encounter: Secondary | ICD-10-CM | POA: Diagnosis not present

## 2017-08-31 DIAGNOSIS — S134XXA Sprain of ligaments of cervical spine, initial encounter: Secondary | ICD-10-CM | POA: Diagnosis not present

## 2017-08-31 DIAGNOSIS — M4125 Other idiopathic scoliosis, thoracolumbar region: Secondary | ICD-10-CM | POA: Diagnosis not present

## 2017-08-31 DIAGNOSIS — S335XXA Sprain of ligaments of lumbar spine, initial encounter: Secondary | ICD-10-CM | POA: Diagnosis not present

## 2017-09-17 ENCOUNTER — Other Ambulatory Visit (INDEPENDENT_AMBULATORY_CARE_PROVIDER_SITE_OTHER): Payer: Self-pay | Admitting: Internal Medicine

## 2017-09-18 DIAGNOSIS — Z01419 Encounter for gynecological examination (general) (routine) without abnormal findings: Secondary | ICD-10-CM | POA: Diagnosis not present

## 2017-09-18 DIAGNOSIS — Z6825 Body mass index (BMI) 25.0-25.9, adult: Secondary | ICD-10-CM | POA: Diagnosis not present

## 2017-09-23 ENCOUNTER — Encounter (INDEPENDENT_AMBULATORY_CARE_PROVIDER_SITE_OTHER): Payer: Self-pay | Admitting: Internal Medicine

## 2017-09-23 ENCOUNTER — Ambulatory Visit (INDEPENDENT_AMBULATORY_CARE_PROVIDER_SITE_OTHER): Payer: BLUE CROSS/BLUE SHIELD | Admitting: Internal Medicine

## 2017-09-23 VITALS — BP 118/82 | HR 72 | Temp 98.2°F | Ht 62.5 in | Wt 138.8 lb

## 2017-09-23 DIAGNOSIS — K219 Gastro-esophageal reflux disease without esophagitis: Secondary | ICD-10-CM | POA: Diagnosis not present

## 2017-09-23 DIAGNOSIS — R140 Abdominal distension (gaseous): Secondary | ICD-10-CM | POA: Diagnosis not present

## 2017-09-23 NOTE — Patient Instructions (Addendum)
Start the Protonix Samples of IBGARD given to patient (4 boxes)

## 2017-09-23 NOTE — Progress Notes (Signed)
   Subjective:    Patient ID: Misty Mcdowell, female    DOB: [birth year], 51 y.o.   MRN: [id number]  HPI Here today for f/u. Last seen in November of 2017 with bloating.  Had been on Omeprazole but stopped.  Apparently saw Dr. Derenda Mis and she was given an Rx for  Protonix. She has not started the Protonix. Her appetite is good. No weight loss. She does have bloating :all the time".  She does not have " reflux acid". She does bloat and sometimes has nausea. She has a BM x 1 a day with the Linzess. No melena or BRRB.  Some Nausea and vomiting at times after eating.  She is not exercising.  Family hx of colon cancer (father in his 67s). Last colonoscopy in 201.  Impression: - One 6 to 12 mm polyp in the cecum, removed with a  hot snare. Resected and retrieved. Clip (MR  conditional) was placed. - The examination was otherwise normal. - External hemorrhoids. Polyp was a tubular adenoma. Results reviewed with patient. Next colonoscopy in 5 years.  Review of Systems Past Medical History:  Diagnosis Date  . Anemia   . Scoliosis     Past Surgical History:  Procedure Laterality Date  . COLONOSCOPY  06/26/2011   Procedure: COLONOSCOPY;  Surgeon: Rogene Houston, MD;  [facility];  Service: Endoscopy;  Laterality: N/A;  . COLONOSCOPY N/A 07/03/2016   Procedure: COLONOSCOPY;  Surgeon: Rogene Houston, MD;  [facility];  Service: Endoscopy;  Laterality: N/A;  930  . ESOPHAGOGASTRODUODENOSCOPY N/A 09/26/2015   Procedure: ESOPHAGOGASTRODUODENOSCOPY (EGD);  Surgeon: Rogene Houston, MD;  [facility];  Service: Endoscopy;  Laterality: N/A;  3:00   . TUBAL LIGATION      No Known Allergies  Current Outpatient Prescriptions on File Prior to Visit  Medication Sig Dispense Refill  . LINZESS 145 MCG CAPS capsule TAKE ONE CAPSULE BY MOUTH DAILY  90 capsule 0   No current facility-administered medications on file prior to visit.         Objective:   Physical Exam Blood pressure 118/82, pulse 72, temperature 98.2 F (36.8 C), height 5' 2.5" (1.588 m), weight 138 lb 12.8 oz (63 kg). Alert and oriented. Skin warm and dry. Oral mucosa is moist.   . Sclera anicteric, conjunctivae is pink. Thyroid not enlarged. No cervical lymphadenopathy. Lungs clear. Heart regular rate and rhythm.  Abdomen is soft. Bowel sounds are positive. No hepatomegaly. No abdominal masses felt. No tenderness.  No edema to lower extremities.           Assessment & Plan:  Bloating. She will start the Protonix.  Will give samples of IBGARD. OV in 1 year

## 2017-09-28 DIAGNOSIS — S335XXA Sprain of ligaments of lumbar spine, initial encounter: Secondary | ICD-10-CM | POA: Diagnosis not present

## 2017-09-28 DIAGNOSIS — S134XXA Sprain of ligaments of cervical spine, initial encounter: Secondary | ICD-10-CM | POA: Diagnosis not present

## 2017-09-28 DIAGNOSIS — M4125 Other idiopathic scoliosis, thoracolumbar region: Secondary | ICD-10-CM | POA: Diagnosis not present

## 2017-10-26 DIAGNOSIS — S335XXA Sprain of ligaments of lumbar spine, initial encounter: Secondary | ICD-10-CM | POA: Diagnosis not present

## 2017-10-26 DIAGNOSIS — M4125 Other idiopathic scoliosis, thoracolumbar region: Secondary | ICD-10-CM | POA: Diagnosis not present

## 2017-10-26 DIAGNOSIS — S134XXA Sprain of ligaments of cervical spine, initial encounter: Secondary | ICD-10-CM | POA: Diagnosis not present

## 2017-10-27 ENCOUNTER — Ambulatory Visit (INDEPENDENT_AMBULATORY_CARE_PROVIDER_SITE_OTHER): Payer: BLUE CROSS/BLUE SHIELD | Admitting: Internal Medicine

## 2017-11-26 DIAGNOSIS — S335XXA Sprain of ligaments of lumbar spine, initial encounter: Secondary | ICD-10-CM | POA: Diagnosis not present

## 2017-11-26 DIAGNOSIS — M4125 Other idiopathic scoliosis, thoracolumbar region: Secondary | ICD-10-CM | POA: Diagnosis not present

## 2017-11-26 DIAGNOSIS — S134XXA Sprain of ligaments of cervical spine, initial encounter: Secondary | ICD-10-CM | POA: Diagnosis not present

## 2017-11-28 ENCOUNTER — Encounter (INDEPENDENT_AMBULATORY_CARE_PROVIDER_SITE_OTHER): Payer: Self-pay | Admitting: Internal Medicine

## 2017-12-02 ENCOUNTER — Telehealth (INDEPENDENT_AMBULATORY_CARE_PROVIDER_SITE_OTHER): Payer: Self-pay | Admitting: Internal Medicine

## 2017-12-02 DIAGNOSIS — K219 Gastro-esophageal reflux disease without esophagitis: Secondary | ICD-10-CM

## 2017-12-02 MED ORDER — PANTOPRAZOLE SODIUM 40 MG PO TBEC: 40.0000 mg | DELAYED_RELEASE_TABLET | Freq: Every day | ORAL | 4 refills | Status: DC

## 2017-12-02 NOTE — Telephone Encounter (Signed)
Rx for PRotonix sent to her pharmacy

## 2017-12-11 ENCOUNTER — Other Ambulatory Visit (INDEPENDENT_AMBULATORY_CARE_PROVIDER_SITE_OTHER): Payer: Self-pay | Admitting: Internal Medicine

## 2017-12-28 DIAGNOSIS — M4125 Other idiopathic scoliosis, thoracolumbar region: Secondary | ICD-10-CM | POA: Diagnosis not present

## 2017-12-28 DIAGNOSIS — S134XXA Sprain of ligaments of cervical spine, initial encounter: Secondary | ICD-10-CM | POA: Diagnosis not present

## 2017-12-28 DIAGNOSIS — S335XXA Sprain of ligaments of lumbar spine, initial encounter: Secondary | ICD-10-CM | POA: Diagnosis not present

## 2017-12-30 DIAGNOSIS — M419 Scoliosis, unspecified: Secondary | ICD-10-CM | POA: Diagnosis not present

## 2017-12-30 DIAGNOSIS — Z6826 Body mass index (BMI) 26.0-26.9, adult: Secondary | ICD-10-CM | POA: Diagnosis not present

## 2017-12-30 DIAGNOSIS — M545 Low back pain: Secondary | ICD-10-CM | POA: Diagnosis not present

## 2018-02-02 DIAGNOSIS — S335XXA Sprain of ligaments of lumbar spine, initial encounter: Secondary | ICD-10-CM | POA: Diagnosis not present

## 2018-02-02 DIAGNOSIS — M4125 Other idiopathic scoliosis, thoracolumbar region: Secondary | ICD-10-CM | POA: Diagnosis not present

## 2018-02-02 DIAGNOSIS — S134XXA Sprain of ligaments of cervical spine, initial encounter: Secondary | ICD-10-CM | POA: Diagnosis not present

## 2018-02-15 DIAGNOSIS — S93691A Other sprain of right foot, initial encounter: Secondary | ICD-10-CM | POA: Diagnosis not present

## 2018-02-15 DIAGNOSIS — Z6825 Body mass index (BMI) 25.0-25.9, adult: Secondary | ICD-10-CM | POA: Diagnosis not present

## 2018-02-15 DIAGNOSIS — S93431A Sprain of tibiofibular ligament of right ankle, initial encounter: Secondary | ICD-10-CM | POA: Diagnosis not present

## 2018-04-05 DIAGNOSIS — S134XXA Sprain of ligaments of cervical spine, initial encounter: Secondary | ICD-10-CM | POA: Diagnosis not present

## 2018-04-05 DIAGNOSIS — M4125 Other idiopathic scoliosis, thoracolumbar region: Secondary | ICD-10-CM | POA: Diagnosis not present

## 2018-04-05 DIAGNOSIS — S335XXA Sprain of ligaments of lumbar spine, initial encounter: Secondary | ICD-10-CM | POA: Diagnosis not present

## 2018-05-03 DIAGNOSIS — S134XXA Sprain of ligaments of cervical spine, initial encounter: Secondary | ICD-10-CM | POA: Diagnosis not present

## 2018-05-03 DIAGNOSIS — S335XXA Sprain of ligaments of lumbar spine, initial encounter: Secondary | ICD-10-CM | POA: Diagnosis not present

## 2018-05-03 DIAGNOSIS — M4125 Other idiopathic scoliosis, thoracolumbar region: Secondary | ICD-10-CM | POA: Diagnosis not present

## 2018-05-04 DIAGNOSIS — J028 Acute pharyngitis due to other specified organisms: Secondary | ICD-10-CM | POA: Diagnosis not present

## 2018-05-04 DIAGNOSIS — J019 Acute sinusitis, unspecified: Secondary | ICD-10-CM | POA: Diagnosis not present

## 2018-05-04 DIAGNOSIS — Z6825 Body mass index (BMI) 25.0-25.9, adult: Secondary | ICD-10-CM | POA: Diagnosis not present

## 2018-06-09 DIAGNOSIS — M4125 Other idiopathic scoliosis, thoracolumbar region: Secondary | ICD-10-CM | POA: Diagnosis not present

## 2018-06-09 DIAGNOSIS — S335XXA Sprain of ligaments of lumbar spine, initial encounter: Secondary | ICD-10-CM | POA: Diagnosis not present

## 2018-06-09 DIAGNOSIS — S134XXA Sprain of ligaments of cervical spine, initial encounter: Secondary | ICD-10-CM | POA: Diagnosis not present

## 2018-07-05 DIAGNOSIS — S134XXA Sprain of ligaments of cervical spine, initial encounter: Secondary | ICD-10-CM | POA: Diagnosis not present

## 2018-07-05 DIAGNOSIS — S335XXA Sprain of ligaments of lumbar spine, initial encounter: Secondary | ICD-10-CM | POA: Diagnosis not present

## 2018-07-05 DIAGNOSIS — M4125 Other idiopathic scoliosis, thoracolumbar region: Secondary | ICD-10-CM | POA: Diagnosis not present

## 2018-07-08 DIAGNOSIS — K581 Irritable bowel syndrome with constipation: Secondary | ICD-10-CM | POA: Diagnosis not present

## 2018-07-08 DIAGNOSIS — Z0001 Encounter for general adult medical examination with abnormal findings: Secondary | ICD-10-CM | POA: Diagnosis not present

## 2018-07-08 DIAGNOSIS — M419 Scoliosis, unspecified: Secondary | ICD-10-CM | POA: Diagnosis not present

## 2018-07-08 DIAGNOSIS — K210 Gastro-esophageal reflux disease with esophagitis: Secondary | ICD-10-CM | POA: Diagnosis not present

## 2018-07-08 DIAGNOSIS — Z6826 Body mass index (BMI) 26.0-26.9, adult: Secondary | ICD-10-CM | POA: Diagnosis not present

## 2018-07-23 DIAGNOSIS — Z1231 Encounter for screening mammogram for malignant neoplasm of breast: Secondary | ICD-10-CM | POA: Diagnosis not present

## 2018-08-09 DIAGNOSIS — S335XXA Sprain of ligaments of lumbar spine, initial encounter: Secondary | ICD-10-CM | POA: Diagnosis not present

## 2018-08-09 DIAGNOSIS — M4125 Other idiopathic scoliosis, thoracolumbar region: Secondary | ICD-10-CM | POA: Diagnosis not present

## 2018-08-09 DIAGNOSIS — S134XXA Sprain of ligaments of cervical spine, initial encounter: Secondary | ICD-10-CM | POA: Diagnosis not present

## 2018-09-06 DIAGNOSIS — S335XXA Sprain of ligaments of lumbar spine, initial encounter: Secondary | ICD-10-CM | POA: Diagnosis not present

## 2018-09-06 DIAGNOSIS — S134XXA Sprain of ligaments of cervical spine, initial encounter: Secondary | ICD-10-CM | POA: Diagnosis not present

## 2018-09-06 DIAGNOSIS — M4125 Other idiopathic scoliosis, thoracolumbar region: Secondary | ICD-10-CM | POA: Diagnosis not present

## 2018-09-23 ENCOUNTER — Ambulatory Visit (INDEPENDENT_AMBULATORY_CARE_PROVIDER_SITE_OTHER): Payer: BLUE CROSS/BLUE SHIELD | Admitting: Internal Medicine

## 2018-09-23 ENCOUNTER — Encounter (INDEPENDENT_AMBULATORY_CARE_PROVIDER_SITE_OTHER): Payer: Self-pay | Admitting: Internal Medicine

## 2018-09-23 VITALS — BP 154/80 | HR 65 | Temp 98.6°F | Ht 62.5 in | Wt 146.6 lb

## 2018-09-23 DIAGNOSIS — K219 Gastro-esophageal reflux disease without esophagitis: Secondary | ICD-10-CM

## 2018-09-23 MED ORDER — OMEPRAZOLE 40 MG PO CPDR: 40.0000 mg | DELAYED_RELEASE_CAPSULE | Freq: Every day | ORAL | 3 refills | Status: DC

## 2018-09-23 NOTE — Patient Instructions (Addendum)
She will start the Omeprazole. OV in 1 year.  Gastroesophageal Reflux Disease, Adult Normally, food travels down the esophagus and stays in the stomach to be digested. If a person has gastroesophageal reflux disease (GERD), food and stomach acid move back up into the esophagus. When this happens, the esophagus becomes sore and swollen (inflamed). Over time, GERD can make small holes (ulcers) in the lining of the esophagus. Follow these instructions at home: Diet  Follow a diet as told by your doctor. You may need to avoid foods and drinks such as: ? Coffee and tea (with or without caffeine). ? Drinks that contain alcohol. ? Energy drinks and sports drinks. ? Carbonated drinks or sodas. ? Chocolate and cocoa. ? Peppermint and mint flavorings. ? Garlic and onions. ? Horseradish. ? Spicy and acidic foods, such as peppers, chili powder, curry powder, vinegar, hot sauces, and BBQ sauce. ? Citrus fruit juices and citrus fruits, such as oranges, lemons, and limes. ? Tomato-based foods, such as red sauce, chili, salsa, and pizza with red sauce. ? Fried and fatty foods, such as donuts, french fries, potato chips, and high-fat dressings. ? High-fat meats, such as hot dogs, rib eye steak, sausage, ham, and bacon. ? High-fat dairy items, such as whole milk, butter, and cream cheese.  Eat small meals often. Avoid eating large meals.  Avoid drinking large amounts of liquid with your meals.  Avoid eating meals during the 2-3 hours before bedtime.  Avoid lying down right after you eat.  Do not exercise right after you eat. General instructions  Pay attention to any changes in your symptoms.  Take over-the-counter and prescription medicines only as told by your doctor. Do not take aspirin, ibuprofen, or other NSAIDs unless your doctor says it is okay.  Do not use any tobacco products, including cigarettes, chewing tobacco, and e-cigarettes. If you need help quitting, ask your doctor.  Wear  loose clothes. Do not wear anything tight around your waist.  Raise (elevate) the head of your bed about 6 inches (15 cm).  Try to lower your stress. If you need help doing this, ask your doctor.  If you are overweight, lose an amount of weight that is healthy for you. Ask your doctor about a safe weight loss goal.  Keep all follow-up visits as told by your doctor. This is important. Contact a doctor if:  You have new symptoms.  You lose weight and you do not know why it is happening.  You have trouble swallowing, or it hurts to swallow.  You have wheezing or a cough that keeps happening.  Your symptoms do not get better with treatment.  You have a hoarse voice. Get help right away if:  You have pain in your arms, neck, jaw, teeth, or back.  You feel sweaty, dizzy, or light-headed.  You have chest pain or shortness of breath.  You throw up (vomit) and your throw up looks like blood or coffee grounds.  You pass out (faint).  Your poop (stool) is bloody or black.  You cannot swallow, drink, or eat. This information is not intended to replace advice given to you by your health care provider. Make sure you discuss any questions you have with your health care provider. Document Released: 05/05/2008 Document Revised: 04/24/2016 Document Reviewed: 03/14/2015 Elsevier Interactive Patient Education  [employer].

## 2018-09-23 NOTE — Progress Notes (Signed)
   Subjective:    Patient ID: Misty Mcdowell, female    DOB: Apr 29, 1966, 52 y.o.   MRN: [id number]  HPI Here today for f/u. Last seen in October of 2018.  Hx of GERD. She is not taking anything for her GERD at this time. NO GERD. If she drinks etoh she becomes deathly sick.  Underwent an EGD in October of 2016 for epigastric pain and postprandial bloating.  Impression:  Small sliding hiatal hernia.  Mild portal gastropathy. Biopsy taken from mucosa of gastric body for routine histology. Her appetite is good. No weight loss.   BM x 1 a day. No melena or BRRB. Taking  LInzess which helps. She does have some bloating.     07/03/2016 Colonoscopy:  Family hx of colon cancer (father in his 15s).   Impression: - One 6 to 12 mm polyp in the cecum, removed with a  hot snare. Resected and retrieved. Clip (MR  conditional) was placed. - The examination was otherwise normal. - External hemorrhoids. Polyp was a tubular adenoma. Results reviewed with patient. Next colonoscopy in 5 years. Review of Systems Past Medical History:  Diagnosis Date  . Anemia   . Scoliosis     Past Surgical History:  Procedure Laterality Date  . COLONOSCOPY  06/26/2011   Procedure: COLONOSCOPY;  Surgeon: Rogene Houston, MD;  [facility];  Service: Endoscopy;  Laterality: N/A;  . COLONOSCOPY N/A 07/03/2016   Procedure: COLONOSCOPY;  Surgeon: Rogene Houston, MD;  [facility];  Service: Endoscopy;  Laterality: N/A;  930  . ESOPHAGOGASTRODUODENOSCOPY N/A 09/26/2015   Procedure: ESOPHAGOGASTRODUODENOSCOPY (EGD);  Surgeon: Rogene Houston, MD;  [facility];  Service: Endoscopy;  Laterality: N/A;  3:00   . TUBAL LIGATION      No Known Allergies  Current Outpatient Medications on File Prior to Visit  Medication Sig Dispense Refill  . LINZESS 145 MCG CAPS capsule  TAKE ONE CAPSULE BY MOUTH DAILY 90 capsule 3  . pantoprazole (PROTONIX) 40 MG tablet Take 1 tablet (40 mg total) by mouth daily. (Patient not taking: Reported on 09/23/2018) 90 tablet 4   No current facility-administered medications on file prior to visit.         Objective:   Physical Exam Blood pressure (!) 154/80, pulse 65, temperature 98.6 F (37 C), height 5' 2.5" (1.588 m), weight 146 lb 9.6 oz (66.5 kg). Alert and oriented. Skin warm and dry. Oral mucosa is moist.   . Sclera anicteric, conjunctivae is pink. Thyroid not enlarged. No cervical lymphadenopathy. Lungs clear. Heart regular rate and rhythm.  Abdomen is soft. Bowel sounds are positive. No hepatomegaly. No abdominal masses felt. No tenderness.  No edema to lower extremities.           Assessment & Plan:  GERD. Start the Omeprazole. OV in 1 years.

## 2018-09-27 DIAGNOSIS — H16223 Keratoconjunctivitis sicca, not specified as Sjogren's, bilateral: Secondary | ICD-10-CM | POA: Diagnosis not present

## 2018-09-27 DIAGNOSIS — H5203 Hypermetropia, bilateral: Secondary | ICD-10-CM | POA: Diagnosis not present

## 2018-09-27 DIAGNOSIS — H524 Presbyopia: Secondary | ICD-10-CM | POA: Diagnosis not present

## 2018-09-27 DIAGNOSIS — H52223 Regular astigmatism, bilateral: Secondary | ICD-10-CM | POA: Diagnosis not present

## 2018-09-30 DIAGNOSIS — Z01419 Encounter for gynecological examination (general) (routine) without abnormal findings: Secondary | ICD-10-CM | POA: Diagnosis not present

## 2018-09-30 DIAGNOSIS — Z6826 Body mass index (BMI) 26.0-26.9, adult: Secondary | ICD-10-CM | POA: Diagnosis not present

## 2018-10-04 DIAGNOSIS — S134XXA Sprain of ligaments of cervical spine, initial encounter: Secondary | ICD-10-CM | POA: Diagnosis not present

## 2018-10-04 DIAGNOSIS — M4125 Other idiopathic scoliosis, thoracolumbar region: Secondary | ICD-10-CM | POA: Diagnosis not present

## 2018-10-04 DIAGNOSIS — S335XXA Sprain of ligaments of lumbar spine, initial encounter: Secondary | ICD-10-CM | POA: Diagnosis not present

## 2018-10-25 ENCOUNTER — Other Ambulatory Visit (INDEPENDENT_AMBULATORY_CARE_PROVIDER_SITE_OTHER): Payer: Self-pay | Admitting: Internal Medicine

## 2018-11-03 DIAGNOSIS — S335XXA Sprain of ligaments of lumbar spine, initial encounter: Secondary | ICD-10-CM | POA: Diagnosis not present

## 2018-11-03 DIAGNOSIS — S134XXA Sprain of ligaments of cervical spine, initial encounter: Secondary | ICD-10-CM | POA: Diagnosis not present

## 2018-11-03 DIAGNOSIS — M4125 Other idiopathic scoliosis, thoracolumbar region: Secondary | ICD-10-CM | POA: Diagnosis not present

## 2018-11-15 ENCOUNTER — Ambulatory Visit: Payer: Self-pay | Admitting: Podiatry

## 2018-11-15 DIAGNOSIS — B351 Tinea unguium: Secondary | ICD-10-CM

## 2018-11-17 DIAGNOSIS — S63502A Unspecified sprain of left wrist, initial encounter: Secondary | ICD-10-CM | POA: Diagnosis not present

## 2018-11-17 DIAGNOSIS — Z6826 Body mass index (BMI) 26.0-26.9, adult: Secondary | ICD-10-CM | POA: Diagnosis not present

## 2018-11-17 DIAGNOSIS — M79632 Pain in left forearm: Secondary | ICD-10-CM | POA: Diagnosis not present

## 2018-11-17 NOTE — Progress Notes (Signed)
Pt presents with mycotic infection of nails 1-5 bilateral.  All other systems are negative  Laser therapy administered to affected nails and tolerated well. All safety precautions were in place.  2nd treatment.  Follow up in 4 weeks     

## 2018-12-07 DIAGNOSIS — S134XXA Sprain of ligaments of cervical spine, initial encounter: Secondary | ICD-10-CM | POA: Diagnosis not present

## 2018-12-07 DIAGNOSIS — S335XXA Sprain of ligaments of lumbar spine, initial encounter: Secondary | ICD-10-CM | POA: Diagnosis not present

## 2018-12-07 DIAGNOSIS — M4125 Other idiopathic scoliosis, thoracolumbar region: Secondary | ICD-10-CM | POA: Diagnosis not present

## 2018-12-13 ENCOUNTER — Ambulatory Visit: Payer: Self-pay

## 2018-12-13 DIAGNOSIS — B351 Tinea unguium: Secondary | ICD-10-CM

## 2018-12-20 NOTE — Progress Notes (Signed)
Pt presents with mycotic infection of nails 1-5 bilateral.  All other systems are negative  Laser therapy administered to affected nails and tolerated well. All safety precautions were in place.  3rd treatment.  Follow up in 4 weeks     

## 2019-01-03 DIAGNOSIS — S134XXA Sprain of ligaments of cervical spine, initial encounter: Secondary | ICD-10-CM | POA: Diagnosis not present

## 2019-01-03 DIAGNOSIS — M4125 Other idiopathic scoliosis, thoracolumbar region: Secondary | ICD-10-CM | POA: Diagnosis not present

## 2019-01-03 DIAGNOSIS — S335XXA Sprain of ligaments of lumbar spine, initial encounter: Secondary | ICD-10-CM | POA: Diagnosis not present

## 2019-01-24 ENCOUNTER — Other Ambulatory Visit: Payer: BLUE CROSS/BLUE SHIELD

## 2019-01-31 DIAGNOSIS — S134XXA Sprain of ligaments of cervical spine, initial encounter: Secondary | ICD-10-CM | POA: Diagnosis not present

## 2019-01-31 DIAGNOSIS — M4125 Other idiopathic scoliosis, thoracolumbar region: Secondary | ICD-10-CM | POA: Diagnosis not present

## 2019-01-31 DIAGNOSIS — S335XXA Sprain of ligaments of lumbar spine, initial encounter: Secondary | ICD-10-CM | POA: Diagnosis not present

## 2019-02-07 ENCOUNTER — Ambulatory Visit: Payer: BLUE CROSS/BLUE SHIELD

## 2019-02-07 DIAGNOSIS — B351 Tinea unguium: Secondary | ICD-10-CM

## 2019-02-22 ENCOUNTER — Encounter (INDEPENDENT_AMBULATORY_CARE_PROVIDER_SITE_OTHER): Payer: Self-pay

## 2019-03-02 ENCOUNTER — Telehealth (INDEPENDENT_AMBULATORY_CARE_PROVIDER_SITE_OTHER): Payer: Self-pay | Admitting: Internal Medicine

## 2019-03-02 NOTE — Telephone Encounter (Signed)
The patient was made aware that her Insurance would not cover the medication. That she would have to try the Trulance first. Karna Christmas was made aware of this and she talked with the patient again , and got samples for the patient to try.

## 2019-03-02 NOTE — Telephone Encounter (Signed)
Trulance samples x 3 weeks given to patient. She will try this first.

## 2019-03-02 NOTE — Telephone Encounter (Signed)
Tammy, did u receive a prior authorization on this patient for Linzess.  She has been Immunologist.   Thanks, Karna Christmas

## 2019-03-23 ENCOUNTER — Encounter (INDEPENDENT_AMBULATORY_CARE_PROVIDER_SITE_OTHER): Payer: Self-pay

## 2019-03-24 ENCOUNTER — Telehealth (INDEPENDENT_AMBULATORY_CARE_PROVIDER_SITE_OTHER): Payer: Self-pay | Admitting: Internal Medicine

## 2019-03-24 MED ORDER — PLECANATIDE 3 MG PO TABS: 3.0000 mg | ORAL_TABLET | Freq: Every day | ORAL | 3 refills | Status: DC

## 2019-03-24 NOTE — Telephone Encounter (Signed)
Rx sent to her pharmacy 

## 2019-03-24 NOTE — Telephone Encounter (Signed)
err

## 2019-04-04 DIAGNOSIS — M4125 Other idiopathic scoliosis, thoracolumbar region: Secondary | ICD-10-CM | POA: Diagnosis not present

## 2019-04-04 DIAGNOSIS — S134XXA Sprain of ligaments of cervical spine, initial encounter: Secondary | ICD-10-CM | POA: Diagnosis not present

## 2019-04-04 DIAGNOSIS — S335XXA Sprain of ligaments of lumbar spine, initial encounter: Secondary | ICD-10-CM | POA: Diagnosis not present

## 2019-04-18 NOTE — Progress Notes (Signed)
Pt presents with mycotic infection of nails 1-5 bilateral  All other systems are negative  Laser therapy administered to affected nails and tolerated well. All safety precautions were in place. 4th treatment.  Follow up in 4 weeks     

## 2019-04-26 DIAGNOSIS — M19041 Primary osteoarthritis, right hand: Secondary | ICD-10-CM | POA: Diagnosis not present

## 2019-04-26 DIAGNOSIS — Z6826 Body mass index (BMI) 26.0-26.9, adult: Secondary | ICD-10-CM | POA: Diagnosis not present

## 2019-04-26 DIAGNOSIS — M19042 Primary osteoarthritis, left hand: Secondary | ICD-10-CM | POA: Diagnosis not present

## 2019-05-02 DIAGNOSIS — S134XXA Sprain of ligaments of cervical spine, initial encounter: Secondary | ICD-10-CM | POA: Diagnosis not present

## 2019-05-02 DIAGNOSIS — M4125 Other idiopathic scoliosis, thoracolumbar region: Secondary | ICD-10-CM | POA: Diagnosis not present

## 2019-05-02 DIAGNOSIS — S335XXA Sprain of ligaments of lumbar spine, initial encounter: Secondary | ICD-10-CM | POA: Diagnosis not present

## 2019-05-23 DIAGNOSIS — S134XXA Sprain of ligaments of cervical spine, initial encounter: Secondary | ICD-10-CM | POA: Diagnosis not present

## 2019-05-23 DIAGNOSIS — M4125 Other idiopathic scoliosis, thoracolumbar region: Secondary | ICD-10-CM | POA: Diagnosis not present

## 2019-05-23 DIAGNOSIS — S335XXA Sprain of ligaments of lumbar spine, initial encounter: Secondary | ICD-10-CM | POA: Diagnosis not present

## 2019-06-16 DIAGNOSIS — Z1159 Encounter for screening for other viral diseases: Secondary | ICD-10-CM | POA: Diagnosis not present

## 2019-06-27 DIAGNOSIS — S134XXA Sprain of ligaments of cervical spine, initial encounter: Secondary | ICD-10-CM | POA: Diagnosis not present

## 2019-06-27 DIAGNOSIS — S335XXA Sprain of ligaments of lumbar spine, initial encounter: Secondary | ICD-10-CM | POA: Diagnosis not present

## 2019-06-27 DIAGNOSIS — M4125 Other idiopathic scoliosis, thoracolumbar region: Secondary | ICD-10-CM | POA: Diagnosis not present

## 2019-06-30 ENCOUNTER — Other Ambulatory Visit (INDEPENDENT_AMBULATORY_CARE_PROVIDER_SITE_OTHER): Payer: Self-pay | Admitting: Internal Medicine

## 2019-07-11 DIAGNOSIS — Z0001 Encounter for general adult medical examination with abnormal findings: Secondary | ICD-10-CM | POA: Diagnosis not present

## 2019-07-11 DIAGNOSIS — Z6824 Body mass index (BMI) 24.0-24.9, adult: Secondary | ICD-10-CM | POA: Diagnosis not present

## 2019-07-25 DIAGNOSIS — S335XXA Sprain of ligaments of lumbar spine, initial encounter: Secondary | ICD-10-CM | POA: Diagnosis not present

## 2019-07-25 DIAGNOSIS — M4125 Other idiopathic scoliosis, thoracolumbar region: Secondary | ICD-10-CM | POA: Diagnosis not present

## 2019-07-25 DIAGNOSIS — S134XXA Sprain of ligaments of cervical spine, initial encounter: Secondary | ICD-10-CM | POA: Diagnosis not present

## 2019-08-15 DIAGNOSIS — Z0001 Encounter for general adult medical examination with abnormal findings: Secondary | ICD-10-CM | POA: Diagnosis not present

## 2019-08-17 DIAGNOSIS — Z6824 Body mass index (BMI) 24.0-24.9, adult: Secondary | ICD-10-CM | POA: Diagnosis not present

## 2019-08-17 DIAGNOSIS — E782 Mixed hyperlipidemia: Secondary | ICD-10-CM | POA: Diagnosis not present

## 2019-08-17 DIAGNOSIS — Z23 Encounter for immunization: Secondary | ICD-10-CM | POA: Diagnosis not present

## 2019-08-17 DIAGNOSIS — K210 Gastro-esophageal reflux disease with esophagitis: Secondary | ICD-10-CM | POA: Diagnosis not present

## 2019-08-17 DIAGNOSIS — Z1389 Encounter for screening for other disorder: Secondary | ICD-10-CM | POA: Diagnosis not present

## 2019-08-17 DIAGNOSIS — Z1331 Encounter for screening for depression: Secondary | ICD-10-CM | POA: Diagnosis not present

## 2019-08-22 DIAGNOSIS — Z1231 Encounter for screening mammogram for malignant neoplasm of breast: Secondary | ICD-10-CM | POA: Diagnosis not present

## 2019-08-24 DIAGNOSIS — M4125 Other idiopathic scoliosis, thoracolumbar region: Secondary | ICD-10-CM | POA: Diagnosis not present

## 2019-08-24 DIAGNOSIS — S134XXA Sprain of ligaments of cervical spine, initial encounter: Secondary | ICD-10-CM | POA: Diagnosis not present

## 2019-08-24 DIAGNOSIS — S335XXA Sprain of ligaments of lumbar spine, initial encounter: Secondary | ICD-10-CM | POA: Diagnosis not present

## 2019-08-30 ENCOUNTER — Telehealth (INDEPENDENT_AMBULATORY_CARE_PROVIDER_SITE_OTHER): Payer: Self-pay | Admitting: Nurse Practitioner

## 2019-08-30 NOTE — Telephone Encounter (Signed)
Mitzie, pls call pt and let her know I refilled her Omeprazole RX, she will need an ov for further refills as her last ov was 08/2018. thx

## 2019-09-07 DIAGNOSIS — E782 Mixed hyperlipidemia: Secondary | ICD-10-CM | POA: Diagnosis not present

## 2019-09-19 DIAGNOSIS — S335XXA Sprain of ligaments of lumbar spine, initial encounter: Secondary | ICD-10-CM | POA: Diagnosis not present

## 2019-09-19 DIAGNOSIS — M4125 Other idiopathic scoliosis, thoracolumbar region: Secondary | ICD-10-CM | POA: Diagnosis not present

## 2019-09-19 DIAGNOSIS — S134XXA Sprain of ligaments of cervical spine, initial encounter: Secondary | ICD-10-CM | POA: Diagnosis not present

## 2019-09-24 NOTE — Progress Notes (Addendum)
Subjective:    Patient ID: Misty Mcdowell, female    DOB: 02/01/1966, 53 y.o.   MRN: [id number]  HPI  Misty Mcdowell is a 53 year old female with a past medical history of GERD and colon polyps. Father with history of colon cancer. She presents today with complaints of having persistent acid reflux. She complains of throat burning which has been daily for the past 3 years. Pantoprazole 40mg  once daily was ineffective. She is on currently taking Omeprazole 40mg  once daily. She has intermittent difficulty swallowing her saliva which typically occurs prior to speaking.  She describes feeling as if something is stuck in her throat.  She does not have any difficulty swallowing liquids or solid foods.  She underwent an EGD 09/26/2015 which identified portal gastropathy, gastric biopsies were positive for H. pylori and a small hiatal hernia was noted.  She was treated for the Helicobacter pylori with Pylera. A posttreatment H. pylori stool antigen or H. pylori breath test was not completed. She denies history of alcohol abuse. She drinks 1 glass of wine 3 to 4 days yearly.  She is taking Meloxicam 15mg  once daily for arthritis.  She reports having arthritis to her hands with associated finger joint swelling.  She went on a keto diet and her bloat, joint pain and swelling significantly reduced.  She was able to stop taking the Meloxicam while she was on the keto diet.  However, her cholesterol level spiked on this diet therefore she switched to a Mediterranean diet which was difficult to comply with. She also has chronic back pain with history of scoliosis. She complains of having abdominal bloat which is chronic. She has chronic constipation. Linzess was effective but her insurance stopped covering the cost of this medication so she was switched to Trulance 3mg  daily. She is passing a formed brown bowel movement most days, but over all she felt better after taking Linzess.  She has seen bright red blood on the  toilet tissue at least once in the past 6 months which she attributes to her external hemorrhoids.  No melena.  Her most recent colonoscopy was 07/03/2016 which identified one 12 mm tubular adenomatous polyp in the cecum. She is scheduled to she he gynecologist within the next few weeks. Her stress level is elevated, husband under treatment for thyroid cancer.   Colonoscopy 07/03/2016:  - One 6 to 12 mm polyp in the cecum, removed with a hot snare. - External hemorrhoids - Recall colonoscopy 5 years.  Colonoscopy 06/26/2011:  - A 62mm rectal lymphoid aggregate polyp. - External hemorrhoid. - single diverticulum to the sigmoid colon.   EGD 09/26/2015: Small sliding hiatal hernia. Mild portal gastropathy. Biopsy taken from mucosa of gastric body for routine histology. Biopsies showed H. Pylori, treated.  Abdominal Sonogram 03/08/2015: Normal liver and spleen   Past Medical History:  Diagnosis Date   Anemia    Scoliosis    Past Surgical History:  Procedure Laterality Date   COLONOSCOPY  06/26/2011   Procedure: COLONOSCOPY;  Surgeon: Rogene Houston, MD;  [facility];  Service: Endoscopy;  Laterality: N/A;   COLONOSCOPY N/A 07/03/2016   Procedure: COLONOSCOPY;  Surgeon: Rogene Houston, MD;  [facility];  Service: Endoscopy;  Laterality: N/A;  930   ESOPHAGOGASTRODUODENOSCOPY N/A 09/26/2015   Procedure: ESOPHAGOGASTRODUODENOSCOPY (EGD);  Surgeon: Rogene Houston, MD;  [facility];  Service: Endoscopy;  Laterality: N/A;  3:00    TUBAL LIGATION  Current Outpatient Medications on File Prior to Visit  Medication Sig Dispense Refill   meloxicam (MOBIC) 15 MG tablet Take 15 mg by mouth daily.     TRULANCE 3 MG TABS TAKE 1 TABLET BY MOUTH EVERY DAY 60 tablet 3   No current facility-administered medications on file prior to visit.     Review of Systems see HPI, all other systems reviewed and are negative     Objective:   Physical Exam BP  129/81    Pulse 63    Temp 98.1 F (36.7 C)    Ht 5\' 2"  (1.575 m)    Wt 138 lb 3.2 oz (62.7 kg)    BMI 25.28 kg/m  General: 53 year old female in no acute distress Eyes: Sclera nonicteric, conjunctiva pink Mouth: Dentition intact, no ulcers or lesions Neck: Supple, no lymphadenopathy or thyromegaly Heart: Regular rate and rhythm, no murmurs Lungs: Breath sounds clear throughout Abdomen: Soft, nontender, no masses or organomegaly, positive bowel sounds all 4 quadrants Extremities: No edema Neuro: Alert and oriented x4, no focal deficits     Assessment & Plan:   1. GERD with daily heartburn x 3 years, HX of H. Pylori gastritis 2016, episodic dysphagia verses laryngospasm when swallowing saliva only -EGD benefits and risks discussed including the risk with sedation, risk of bleeding, perforation and infection -EGD gastric biopsies to check for H. Pylori and duodenal biopsies to rule out celiac dz -Increase omeprazole 40 mg twice daily -Discussed ENT evaluation with laryngoscopy  -Dicussed Meloxicam may be triggering her reflux symptoms  -CBC, CMP, CRP and Celiac panel  2. Colon polyps. Family history of colon cancer -Next colonoscopy due August 2022 -Patient to call the office if rectal bleeding recurs, if so an earlier colonoscopy would be warranted  3.  Constipation -Continue Trulance 3 mg 1 tab p.o. daily for now -Probiotic of choice once daily  3. Joint pain, hands/fingers and right knee -recommended rheumatology consult   Further follow-up to be determined after the above evaluation completed

## 2019-09-26 ENCOUNTER — Encounter (INDEPENDENT_AMBULATORY_CARE_PROVIDER_SITE_OTHER): Payer: Self-pay | Admitting: Nurse Practitioner

## 2019-09-26 ENCOUNTER — Other Ambulatory Visit: Payer: Self-pay

## 2019-09-26 ENCOUNTER — Encounter (INDEPENDENT_AMBULATORY_CARE_PROVIDER_SITE_OTHER): Payer: Self-pay | Admitting: *Deleted

## 2019-09-26 ENCOUNTER — Other Ambulatory Visit (INDEPENDENT_AMBULATORY_CARE_PROVIDER_SITE_OTHER): Payer: Self-pay | Admitting: *Deleted

## 2019-09-26 ENCOUNTER — Ambulatory Visit (INDEPENDENT_AMBULATORY_CARE_PROVIDER_SITE_OTHER): Payer: BC Managed Care – PPO | Admitting: Nurse Practitioner

## 2019-09-26 DIAGNOSIS — Z800 Family history of malignant neoplasm of digestive organs: Secondary | ICD-10-CM

## 2019-09-26 DIAGNOSIS — R140 Abdominal distension (gaseous): Secondary | ICD-10-CM | POA: Diagnosis not present

## 2019-09-26 DIAGNOSIS — K5900 Constipation, unspecified: Secondary | ICD-10-CM

## 2019-09-26 DIAGNOSIS — Z860100 Personal history of colon polyps, unspecified: Secondary | ICD-10-CM | POA: Insufficient documentation

## 2019-09-26 DIAGNOSIS — K219 Gastro-esophageal reflux disease without esophagitis: Secondary | ICD-10-CM

## 2019-09-26 MED ORDER — OMEPRAZOLE 40 MG PO CPDR: 40.0000 mg | DELAYED_RELEASE_CAPSULE | Freq: Two times a day (BID) | ORAL | 0 refills | Status: DC

## 2019-09-26 NOTE — Patient Instructions (Addendum)
1.  Schedule an upper endoscopy  2.  Complete the provided lab order  3.  Increase omeprazole 40 mg 1 capsule to be taken 30 minutes before breakfast and 1 capsule to be taken 30 minutes before dinner  4.  Consider  scheduling a consult with a rheumatologist for hand joint pain and swelling

## 2019-09-27 LAB — CBC WITH DIFFERENTIAL/PLATELET
Absolute Monocytes: 370 cells/uL (ref 200–950)
Basophils Absolute: 20 cells/uL (ref 0–200)
Basophils Relative: 0.3 %
Eosinophils Absolute: 33 cells/uL (ref 15–500)
Eosinophils Relative: 0.5 %
HCT: 44.2 % (ref 35.0–45.0)
Hemoglobin: 14.6 g/dL (ref 11.7–15.5)
Lymphs Abs: 1472 cells/uL (ref 850–3900)
MCH: 31.3 pg (ref 27.0–33.0)
MCHC: 33 g/dL (ref 32.0–36.0)
MCV: 94.8 fL (ref 80.0–100.0)
MPV: 10.1 fL (ref 7.5–12.5)
Monocytes Relative: 5.6 %
Neutro Abs: 4706 cells/uL (ref 1500–7800)
Neutrophils Relative %: 71.3 %
Platelets: 276 10*3/uL (ref 140–400)
RBC: 4.66 10*6/uL (ref 3.80–5.10)
RDW: 13.1 % (ref 11.0–15.0)
Total Lymphocyte: 22.3 %
WBC: 6.6 10*3/uL (ref 3.8–10.8)

## 2019-09-27 LAB — COMPLETE METABOLIC PANEL WITH GFR
AG Ratio: 1.7 (calc) (ref 1.0–2.5)
ALT: 15 U/L (ref 6–29)
AST: 20 U/L (ref 10–35)
Albumin: 4.7 g/dL (ref 3.6–5.1)
Alkaline phosphatase (APISO): 59 U/L (ref 37–153)
BUN: 8 mg/dL (ref 7–25)
CO2: 25 mmol/L (ref 20–32)
Calcium: 9.9 mg/dL (ref 8.6–10.4)
Chloride: 106 mmol/L (ref 98–110)
Creat: 0.75 mg/dL (ref 0.50–1.05)
GFR, Est African American: 107 mL/min/{1.73_m2} (ref >=60)
GFR, Est Non African American: 92 mL/min/{1.73_m2} (ref >=60)
Globulin: 2.7 g/dL (calc) (ref 1.9–3.7)
Glucose, Bld: 83 mg/dL (ref 65–139)
Potassium: 4.3 mmol/L (ref 3.5–5.3)
Sodium: 142 mmol/L (ref 135–146)
Total Bilirubin: 0.4 mg/dL (ref 0.2–1.2)
Total Protein: 7.4 g/dL (ref 6.1–8.1)

## 2019-09-27 LAB — CELIAC DISEASE PANEL
(tTG) Ab, IgA: 1 U/mL
(tTG) Ab, IgG: 2 U/mL
Gliadin IgA: 3 Units
Gliadin IgG: 2 Units
Immunoglobulin A: 157 mg/dL (ref 47–310)

## 2019-09-27 LAB — C-REACTIVE PROTEIN: CRP: 0.7 mg/L (ref <8.0)

## 2019-10-10 DIAGNOSIS — Z01419 Encounter for gynecological examination (general) (routine) without abnormal findings: Secondary | ICD-10-CM | POA: Diagnosis not present

## 2019-10-10 DIAGNOSIS — Z6824 Body mass index (BMI) 24.0-24.9, adult: Secondary | ICD-10-CM | POA: Diagnosis not present

## 2019-10-17 DIAGNOSIS — S335XXA Sprain of ligaments of lumbar spine, initial encounter: Secondary | ICD-10-CM | POA: Diagnosis not present

## 2019-10-17 DIAGNOSIS — S134XXA Sprain of ligaments of cervical spine, initial encounter: Secondary | ICD-10-CM | POA: Diagnosis not present

## 2019-10-17 DIAGNOSIS — M4125 Other idiopathic scoliosis, thoracolumbar region: Secondary | ICD-10-CM | POA: Diagnosis not present

## 2019-10-18 ENCOUNTER — Encounter (INDEPENDENT_AMBULATORY_CARE_PROVIDER_SITE_OTHER): Payer: Self-pay

## 2019-10-19 DIAGNOSIS — Z03818 Encounter for observation for suspected exposure to other biological agents ruled out: Secondary | ICD-10-CM | POA: Diagnosis not present

## 2019-10-19 DIAGNOSIS — Z1159 Encounter for screening for other viral diseases: Secondary | ICD-10-CM | POA: Diagnosis not present

## 2019-10-20 ENCOUNTER — Other Ambulatory Visit (INDEPENDENT_AMBULATORY_CARE_PROVIDER_SITE_OTHER): Payer: Self-pay | Admitting: Nurse Practitioner

## 2019-10-20 ENCOUNTER — Telehealth (INDEPENDENT_AMBULATORY_CARE_PROVIDER_SITE_OTHER): Payer: Self-pay | Admitting: *Deleted

## 2019-10-20 DIAGNOSIS — K5900 Constipation, unspecified: Secondary | ICD-10-CM

## 2019-10-20 MED ORDER — TRULANCE 3 MG PO TABS: 1.0000 | ORAL_TABLET | Freq: Every day | ORAL | 1 refills | Status: DC

## 2019-10-20 NOTE — Telephone Encounter (Signed)
Hello. I didn't realize this on my last visit but my trulance prescription is written for a qty of 60 when I do a 90 day mail order. Could you please resend my prescription to my mail order pharmacy for a 90 supply? Thank you!

## 2019-10-21 NOTE — Telephone Encounter (Signed)
Rx sent for 90 day supply on 11/19

## 2019-11-01 ENCOUNTER — Other Ambulatory Visit: Payer: Self-pay

## 2019-11-01 ENCOUNTER — Other Ambulatory Visit (INDEPENDENT_AMBULATORY_CARE_PROVIDER_SITE_OTHER): Payer: Self-pay

## 2019-11-01 ENCOUNTER — Other Ambulatory Visit (HOSPITAL_COMMUNITY)
Admission: RE | Admit: 2019-11-01 | Discharge: 2019-11-01 | Disposition: A | Discharge status: Home / Self Care | Payer: BC Managed Care – PPO | Source: Ambulatory Visit | Attending: Internal Medicine | Admitting: Internal Medicine

## 2019-11-01 DIAGNOSIS — Z01812 Encounter for preprocedural laboratory examination: Secondary | ICD-10-CM | POA: Diagnosis not present

## 2019-11-01 DIAGNOSIS — K219 Gastro-esophageal reflux disease without esophagitis: Secondary | ICD-10-CM

## 2019-11-01 DIAGNOSIS — Z20828 Contact with and (suspected) exposure to other viral communicable diseases: Secondary | ICD-10-CM | POA: Insufficient documentation

## 2019-11-01 LAB — SARS CORONAVIRUS 2 (TAT 6-24 HRS): SARS Coronavirus 2: NEGATIVE

## 2019-11-02 MED ORDER — OMEPRAZOLE 40 MG PO CPDR: 40.0000 mg | DELAYED_RELEASE_CAPSULE | Freq: Two times a day (BID) | ORAL | 1 refills | Status: DC

## 2019-11-03 ENCOUNTER — Encounter (HOSPITAL_COMMUNITY)
Admission: RE | Disposition: A | Discharge status: Home / Self Care | Payer: Self-pay | Source: Home / Self Care | Attending: Internal Medicine

## 2019-11-03 ENCOUNTER — Encounter (HOSPITAL_COMMUNITY): Payer: Self-pay

## 2019-11-03 ENCOUNTER — Other Ambulatory Visit: Payer: Self-pay

## 2019-11-03 ENCOUNTER — Ambulatory Visit (HOSPITAL_COMMUNITY)
Admission: RE | Admit: 2019-11-03 | Discharge: 2019-11-03 | Disposition: A | Discharge status: Home / Self Care | Payer: BC Managed Care – PPO | Attending: Internal Medicine | Admitting: Internal Medicine

## 2019-11-03 ENCOUNTER — Encounter (INDEPENDENT_AMBULATORY_CARE_PROVIDER_SITE_OTHER): Payer: Self-pay

## 2019-11-03 DIAGNOSIS — K449 Diaphragmatic hernia without obstruction or gangrene: Secondary | ICD-10-CM | POA: Insufficient documentation

## 2019-11-03 DIAGNOSIS — K2950 Unspecified chronic gastritis without bleeding: Secondary | ICD-10-CM | POA: Insufficient documentation

## 2019-11-03 DIAGNOSIS — K219 Gastro-esophageal reflux disease without esophagitis: Secondary | ICD-10-CM | POA: Diagnosis not present

## 2019-11-03 DIAGNOSIS — Z79899 Other long term (current) drug therapy: Secondary | ICD-10-CM | POA: Insufficient documentation

## 2019-11-03 DIAGNOSIS — K228 Other specified diseases of esophagus: Secondary | ICD-10-CM | POA: Diagnosis not present

## 2019-11-03 HISTORY — PX: BIOPSY: SHX5522

## 2019-11-03 HISTORY — PX: ESOPHAGOGASTRODUODENOSCOPY: SHX5428

## 2019-11-03 SURGERY — EGD (ESOPHAGOGASTRODUODENOSCOPY)
Anesthesia: Moderate Sedation

## 2019-11-03 MED ORDER — MEPERIDINE HCL 50 MG/ML IJ SOLN
INTRAMUSCULAR | Status: DC | PRN
  Administered 2019-11-03 (×2): 25 mg via INTRAVENOUS

## 2019-11-03 MED ORDER — LIDOCAINE VISCOUS HCL 2 % MT SOLN
OROMUCOSAL | Status: AC
  Filled 2019-11-03: qty 15

## 2019-11-03 MED ORDER — MEPERIDINE HCL 50 MG/ML IJ SOLN
INTRAMUSCULAR | Status: AC
  Filled 2019-11-03: qty 1

## 2019-11-03 MED ORDER — DEXLANSOPRAZOLE 60 MG PO CPDR: 60.0000 mg | DELAYED_RELEASE_CAPSULE | Freq: Every day | ORAL | 5 refills | Status: DC

## 2019-11-03 MED ORDER — MIDAZOLAM HCL 5 MG/5ML IJ SOLN
INTRAMUSCULAR | Status: DC | PRN
  Administered 2019-11-03 (×3): 2 mg via INTRAVENOUS

## 2019-11-03 MED ORDER — MIDAZOLAM HCL 5 MG/5ML IJ SOLN
INTRAMUSCULAR | Status: AC
  Filled 2019-11-03: qty 10

## 2019-11-03 MED ORDER — STERILE WATER FOR IRRIGATION IR SOLN
Status: DC | PRN
  Administered 2019-11-03: 1.5 mL

## 2019-11-03 MED ORDER — FAMOTIDINE 20 MG PO TABS: 20.0000 mg | ORAL_TABLET | Freq: Every day | ORAL | Status: DC

## 2019-11-03 MED ORDER — LIDOCAINE VISCOUS HCL 2 % MT SOLN
OROMUCOSAL | Status: DC | PRN
  Administered 2019-11-03: 1 via OROMUCOSAL

## 2019-11-03 MED ORDER — SODIUM CHLORIDE 0.9 % IV SOLN
INTRAVENOUS | Status: DC
  Administered 2019-11-03: 13:00:00 via INTRAVENOUS

## 2019-11-03 NOTE — Op Note (Signed)
[facility] Patient Name: Misty Mcdowell Procedure Date: 11/03/2019 1:57 PM MRN: [id number] Date of Birth: 02/07/1966 Attending MD: Hildred Laser , MD CSN: [id number] Age: 53 Admit Type: Outpatient Procedure:                Upper GI endoscopy Indications:              Follow-up of gastro-esophageal reflux disease; GERD                            unresponsive ti therapy. Providers:                Hildred Laser, MD, Charlsie Quest Theda Sers RN, RN,                            Randa Spike, Technician Referring MD:             Manon Hilding, MD Medicines:                Lidocaine spray, Meperidine 50 mg IV, Midazolam 6                            mg IV Complications:            No immediate complications. Estimated Blood Loss:     Estimated blood loss was minimal. Procedure:                Pre-Anesthesia Assessment:                           - Prior to the procedure, a History and Physical                            was performed, and patient medications and                            allergies were reviewed. The patient's tolerance of                            previous anesthesia was also reviewed. The risks                            and benefits of the procedure and the sedation                            options and risks were discussed with the patient.                            All questions were answered, and informed consent                            was obtained. Prior Anticoagulants: The patient has                            taken no previous anticoagulant or antiplatelet  agents except for NSAID medication. ASA Grade                            Assessment: II - A patient with mild systemic                            disease. After reviewing the risks and benefits,                            the patient was deemed in satisfactory condition to                            undergo the procedure.                           After obtaining informed  consent, the endoscope was                            passed under direct vision. Throughout the                            procedure, the patient's blood pressure, pulse, and                            oxygen saturations were monitored continuously. The                            [id number] ID:[id number]) scope was introduced through the                            mouth, and advanced to the second part of duodenum.                            The upper GI endoscopy was accomplished without                            difficulty. The patient tolerated the procedure                            well. Scope In: 2:36:06 PM Scope Out: 2:49:29 PM Total Procedure Duration: 0 hours 13 minutes 23 seconds  Findings:      The examined esophagus was normal.      The Z-line was irregular and was found 35 cm from the incisors. Biopsies       were taken with a cold forceps for histology. The pathology specimen was       placed into Bottle Number 3.      A 2 cm hiatal hernia was present.      The entire examined stomach was normal. Biopsies were taken with a cold       forceps for histology. The pathology specimen was placed into Bottle       Number 2.      The first portion of the duodenum and second portion of the duodenum       were normal. Biopsies were taken with a cold  forceps for histology. The       pathology specimen was placed into Bottle Number 1. Impression:               - Normal esophagus.                           - Z-line irregular, 35 cm from the incisors.                            Biopsied.                           - 2 cm hiatal hernia.                           - Normal stomach. Biopsied.                           - Normal first portion of the duodenum and second                            portion of the duodenum. Biopsied. Moderate Sedation:      Moderate (conscious) sedation was administered by the endoscopy nurse       and supervised by the endoscopist. The following parameters were        monitored: oxygen saturation, heart rate, blood pressure, CO2       capnography and response to care. Total physician intraservice time was       16 minutes. Recommendation:           - Patient has a contact number available for                            emergencies. The signs and symptoms of potential                            delayed complications were discussed with the                            patient. Return to normal activities tomorrow.                            Written discharge instructions were provided to the                            patient.                           - Resume previous diet today.                           - Discontinue Omeprazole but continue present                            medications.                           - Dexlansoprazole 60 mg po qam.                           -  Famotidine 20 mg po qhs.                           - Await pathology results. Procedure Code(s):        --- Professional ---                           [phone], Esophagogastroduodenoscopy, flexible,                            transoral; with biopsy, single or multiple                           G0500, Moderate sedation services provided by the                            same physician or other qualified health care                            professional performing a gastrointestinal                            endoscopic service that sedation supports,                            requiring the presence of an independent trained                            observer to assist in the monitoring of the                            patient's level of consciousness and physiological                            status; initial 15 minutes of intra-service time;                            patient age 18 years or older (additional time may                            be reported with [phone], as appropriate) Diagnosis Code(s):        --- Professional ---                           K22.8, Other  specified diseases of esophagus                           K44.9, Diaphragmatic hernia without obstruction or                            gangrene                           K21.9, Gastro-esophageal reflux disease without  esophagitis CPT copyright 2019 American Medical Association. All rights reserved. The codes documented in this report are preliminary and upon coder review may  be revised to meet current compliance requirements. Hildred Laser, MD Hildred Laser, MD 11/03/2019 3:05:48 PM This report has been signed electronically. Number of Addenda: 0

## 2019-11-03 NOTE — H&P (Signed)
Misty Mcdowell is an 53 y.o. female.   Chief Complaint: Patient is here for esophagogastroduodenoscopy for HPI: Patient is 53 year old Caucasian female who presents with heartburn as well as throat burning unresponsive therapy.  She was on omeprazole and then pantoprazole and now back to omeprazole but not getting much relief.  She is watching her diet.  She does not smoke cigarettes and drinks and uses the symptoms.  She has been on meloxicam for arthralgias.  She is contemplating referral to a rheumatologist to find a white count of arthritis she has. Since last EGD was in May 2016.  She was found to have small sliding hiatal hernia and H. pylori gastritis and she was treated.  I did not recommend a follow-up testing as she has never had complicated peptic ulcer disease. History is negative for celiac disease.  Celiac antibody panel was negative.  Past Medical History:  Diagnosis Date  . Anemia   . Scoliosis     Past Surgical History:  Procedure Laterality Date  . COLONOSCOPY  06/26/2011   Procedure: COLONOSCOPY;  Surgeon: Rogene Houston, MD;  Location: [facility];  Service: Endoscopy;  Laterality: N/A;  . COLONOSCOPY N/A 07/03/2016   Procedure: COLONOSCOPY;  Surgeon: Rogene Houston, MD;  Location: [facility];  Service: Endoscopy;  Laterality: N/A;  930  . ESOPHAGOGASTRODUODENOSCOPY N/A 09/26/2015   Procedure: ESOPHAGOGASTRODUODENOSCOPY (EGD);  Surgeon: Rogene Houston, MD;  Location: [facility];  Service: Endoscopy;  Laterality: N/A;  3:00   . TUBAL LIGATION      Family History  Problem Relation Age of Onset  . Hypertension Mother   . Colon cancer Father   . Lung cancer Paternal Grandmother    Social History:  reports that she has never smoked. She has never used smokeless tobacco. She reports current alcohol use. She reports that she does not use drugs.  Allergies: No Known Allergies  Medications Prior to Admission  Medication Sig Dispense Refill  . levonorgestrel  (MIRENA) 20 MCG/24HR IUD 1 each by Intrauterine route once.    . meloxicam (MOBIC) 15 MG tablet Take 15 mg by mouth every evening.     . Multiple Vitamin (MULTIVITAMIN WITH MINERALS) TABS tablet Take 1 tablet by mouth every evening.    . Multiple Vitamins-Minerals (IMMUNE SYSTEM BOOSTER PO) Take 1 packet by mouth every evening.    Marland Kitchen omeprazole (PRILOSEC) 40 MG capsule Take 1 capsule (40 mg total) by mouth 2 (two) times daily. Take 1 capsule by mouth to be taken 30 minutes before breakfast and 30 minutes before dinner 180 capsule 1  . Plecanatide (TRULANCE) 3 MG TABS Take 1 tablet by mouth daily. (Patient taking differently: Take 3 mg by mouth daily. ) 90 tablet 1  . Zinc 50 MG TABS Take 100 mg by mouth 2 (two) times daily.      No results found for this or any previous visit (from the past 48 hour(s)). No results found.  ROS  Blood pressure 130/84, pulse 89, temperature 98 F (36.7 C), temperature source Oral, resp. rate 14, SpO2 98 %. Physical Exam  Constitutional: She appears well-developed and well-nourished.  HENT:  Mouth/Throat: Oropharynx is clear and moist.  Eyes: Conjunctivae are normal. No scleral icterus.  Neck: No thyromegaly present.  Cardiovascular: Normal rate, regular rhythm and normal heart sounds.  No murmur heard. Respiratory: Effort normal and breath sounds normal.  GI: Soft. She exhibits no distension and no mass. There is no abdominal tenderness.  Musculoskeletal:  General: No edema.  Lymphadenopathy:    She has no cervical adenopathy.  Neurological: She is alert.  Skin: Skin is warm and dry.     Assessment/Plan Typical and atypical GERD symptoms unresponsive therapy. Patient is on NSAID therapy. Diagnostic EGD.  Hildred Laser, MD 11/03/2019, 2:25 PM

## 2019-11-03 NOTE — Discharge Instructions (Signed)
Discontinue omeprazole. Begin dexlansoprazole 60 mg by mouth 30 minutes before breakfast daily. Famotidine OTC 20 mg by mouth daily at bedtime. Resume other medications and diet as before. Antireflux sheet to be given to the patient. No driving for 24 hours. Physician will call with biopsy results and further recommendations.   Upper Endoscopy, Adult, Care After This sheet gives you information about how to care for yourself after your procedure. Your health care provider may also give you more specific instructions. If you have problems or questions, contact your health care provider. What can I expect after the procedure? After the procedure, it is common to have:  A sore throat.  Mild stomach pain or discomfort.  Bloating.  Nausea. Follow these instructions at home:   Follow instructions from your health care provider about what to eat or drink after your procedure.  Return to your normal activities as told by your health care provider. Ask your health care provider what activities are safe for you.  Take over-the-counter and prescription medicines only as told by your health care provider.  Do not drive for 24 hours if you were given a sedative during your procedure.  Keep all follow-up visits as told by your health care provider. This is important. Contact a health care provider if you have:  A sore throat that lasts longer than one day.  Trouble swallowing. Get help right away if:  You vomit blood or your vomit looks like coffee grounds.  You have: ? A fever. ? Bloody, black, or tarry stools. ? A severe sore throat or you cannot swallow. ? Difficulty breathing. ? Severe pain in your chest or abdomen. Summary  After the procedure, it is common to have a sore throat, mild stomach discomfort, bloating, and nausea.  Do not drive for 24 hours if you were given a sedative during the procedure.  Follow instructions from your health care provider about what to eat  or drink after your procedure.  Return to your normal activities as told by your health care provider. This information is not intended to replace advice given to you by your health care provider. Make sure you discuss any questions you have with your health care provider. Document Released: 05/18/2012 Document Revised: 05/11/2018 Document Reviewed: 04/19/2018 Elsevier Patient Education  2020 [employer].  Hiatal Hernia  A hiatal hernia occurs when part of the stomach slides above the muscle that separates the abdomen from the chest (diaphragm). A person can be born with a hiatal hernia (congenital), or it may develop over time. In almost all cases of hiatal hernia, only the top part of the stomach pushes through the diaphragm. Many people have a hiatal hernia with no symptoms. The larger the hernia, the more likely it is that you will have symptoms. In some cases, a hiatal hernia allows stomach acid to flow back into the tube that carries food from your mouth to your stomach (esophagus). This may cause heartburn symptoms. Severe heartburn symptoms may mean that you have developed a condition called gastroesophageal reflux disease (GERD). What are the causes? This condition is caused by a weakness in the opening (hiatus) where the esophagus passes through the diaphragm to attach to the upper part of the stomach. A person may be born with a weakness in the hiatus, or a weakness can develop over time. What increases the risk? This condition is more likely to develop in:  Older people. Age is a major risk factor for a hiatal hernia, especially if you  are over the age of 58.  Pregnant women.  People who are overweight.  People who have frequent constipation. What are the signs or symptoms? Symptoms of this condition usually develop in the form of GERD symptoms. Symptoms include:  Heartburn.  Belching.  Indigestion.  Trouble swallowing.  Coughing or wheezing.  Sore  throat.  Hoarseness.  Chest pain.  Nausea and vomiting. How is this diagnosed? This condition may be diagnosed during testing for GERD. Tests that may be done include:  X-rays of your stomach or chest.  An upper gastrointestinal (GI) series. This is an X-ray exam of your GI tract that is taken after you swallow a chalky liquid that shows up clearly on the X-ray.  Endoscopy. This is a procedure to look into your stomach using a thin, flexible tube that has a tiny camera and light on the end of it. How is this treated? This condition may be treated by:  Dietary and lifestyle changes to help reduce GERD symptoms.  Medicines. These may include: ? Over-the-counter antacids. ? Medicines that make your stomach empty more quickly. ? Medicines that block the production of stomach acid (H2 blockers). ? Stronger medicines to reduce stomach acid (proton pump inhibitors).  Surgery to repair the hernia, if other treatments are not helping. If you have no symptoms, you may not need treatment. Follow these instructions at home: Lifestyle and activity  Do not use any products that contain nicotine or tobacco, such as cigarettes and e-cigarettes. If you need help quitting, ask your health care provider.  Try to achieve and maintain a healthy body weight.  Avoid putting pressure on your abdomen. Anything that puts pressure on your abdomen increases the amount of acid that may be pushed up into your esophagus. ? Avoid bending over, especially after eating. ? Raise the head of your bed by putting blocks under the legs. This keeps your head and esophagus higher than your stomach. ? Do not wear tight clothing around your chest or stomach. ? Try not to strain when having a bowel movement, when urinating, or when lifting heavy objects. Eating and drinking  Avoid foods that can worsen GERD symptoms. These may include: ? Fatty foods, like fried foods. ? Citrus fruits, like oranges or lemon. ? Other  foods and drinks that contain acid, like orange juice or tomatoes. ? Spicy food. ? Chocolate.  Eat frequent small meals instead of three large meals a day. This helps prevent your stomach from getting too full. ? Eat slowly. ? Do not lie down right after eating. ? Do not eat 1-2 hours before bed.  Do not drink beverages with caffeine. These include cola, coffee, cocoa, and tea.  Do not drink alcohol. General instructions  Take over-the-counter and prescription medicines only as told by your health care provider.  Keep all follow-up visits as told by your health care provider. This is important. Contact a health care provider if:  Your symptoms are not controlled with medicines or lifestyle changes.  You are having trouble swallowing.  You have coughing or wheezing that will not go away. Get help right away if:  Your pain is getting worse.  Your pain spreads to your arms, neck, jaw, teeth, or back.  You have shortness of breath.  You sweat for no reason.  You feel sick to your stomach (nauseous) or you vomit.  You vomit blood.  You have bright red blood in your stools.  You have black, tarry stools. This information is not intended  to replace advice given to you by your health care provider. Make sure you discuss any questions you have with your health care provider. Document Released: 02/07/2004 Document Revised: 10/30/2017 Document Reviewed: 06/22/2017 Elsevier Patient Education  2020 [employer].

## 2019-11-04 DIAGNOSIS — K219 Gastro-esophageal reflux disease without esophagitis: Secondary | ICD-10-CM | POA: Diagnosis not present

## 2019-11-04 DIAGNOSIS — Z6824 Body mass index (BMI) 24.0-24.9, adult: Secondary | ICD-10-CM | POA: Diagnosis not present

## 2019-11-04 DIAGNOSIS — M1990 Unspecified osteoarthritis, unspecified site: Secondary | ICD-10-CM | POA: Diagnosis not present

## 2019-11-07 ENCOUNTER — Other Ambulatory Visit: Payer: Self-pay

## 2019-11-07 LAB — SURGICAL PATHOLOGY

## 2019-11-08 ENCOUNTER — Other Ambulatory Visit (INDEPENDENT_AMBULATORY_CARE_PROVIDER_SITE_OTHER): Payer: Self-pay | Admitting: Internal Medicine

## 2019-11-08 ENCOUNTER — Encounter (HOSPITAL_COMMUNITY): Payer: Self-pay | Admitting: Internal Medicine

## 2019-11-08 MED ORDER — METOCLOPRAMIDE HCL 10 MG PO TABS: 10.0000 mg | ORAL_TABLET | Freq: Three times a day (TID) | ORAL | 0 refills | Status: DC

## 2019-11-10 ENCOUNTER — Telehealth (INDEPENDENT_AMBULATORY_CARE_PROVIDER_SITE_OTHER): Payer: Self-pay | Admitting: Nurse Practitioner

## 2019-11-10 ENCOUNTER — Telehealth (INDEPENDENT_AMBULATORY_CARE_PROVIDER_SITE_OTHER): Payer: Self-pay | Admitting: *Deleted

## 2019-11-10 ENCOUNTER — Encounter (INDEPENDENT_AMBULATORY_CARE_PROVIDER_SITE_OTHER): Payer: Self-pay

## 2019-11-10 ENCOUNTER — Other Ambulatory Visit (INDEPENDENT_AMBULATORY_CARE_PROVIDER_SITE_OTHER): Payer: Self-pay | Admitting: Nurse Practitioner

## 2019-11-10 DIAGNOSIS — R112 Nausea with vomiting, unspecified: Secondary | ICD-10-CM

## 2019-11-10 MED ORDER — PROMETHAZINE HCL 12.5 MG PO TABS: 12.5000 mg | ORAL_TABLET | Freq: Three times a day (TID) | ORAL | 0 refills | Status: DC | PRN

## 2019-11-10 NOTE — Telephone Encounter (Signed)
I called the patient she started Dexilant and famotidine following her EGD on 12/34/2020.  She was prescribed Reglan by Dr. Dorien Chihuahua which she started yesterday.  She took 3 doses yesterday and she awakened in the middle the night with severe headache, constipation.  She passed a bowel movement then developed severe nausea and vomited partially digested food and subsequent vomiting episodes consisted of bile emesis.  She had generalized abdominal pain during her vomiting episodes.  She has not vomited for the past hour.  She has tolerated a few sips of water at this point.  She complains of having a headache.  She is not taking any of her medications this morning.  I advised the patient to stop the Reglan.  Not to take any Trulance as there could be a drug reaction.  I advised the patient to take promethazine 12.5 mg 1 p.o. every 8 hours as needed.  If she is unable to tolerate fluids and or if her urine output decreases she should go to the urgent care center for IV fluid and further assessment or to the ER.  I will provide Dr. Melony Overly with an update.  I will contact the patient with his response.

## 2019-11-10 NOTE — Telephone Encounter (Signed)
This is Misty Mcdowell's daughter, Misty Mcdowell. She is very sick in reaction to the medication she took yesterday. Her eyes are dark and she has been vomiting bile all morning. She needs something to help her. I left a voicemail this morning and have not heard back. Please call me at [phone]. She cant even function she is in so much pain.

## 2019-11-10 NOTE — Telephone Encounter (Signed)
This is Misty Mcdowell's daughter, [name]. She is very sick in reaction to the medication she took yesterday. Her eyes are dark and she has been vomiting bile all morning. She needs something to help her. I left a voicemail this morning and have not heard back. Please call me at [phone]. She cant even function she is in so much pain.

## 2019-11-10 NOTE — Telephone Encounter (Signed)
Patients daughter called stated patient has been vomiting 24 hours after taking Reglan - please advise - ph# [phone]

## 2019-11-14 DIAGNOSIS — S134XXA Sprain of ligaments of cervical spine, initial encounter: Secondary | ICD-10-CM | POA: Diagnosis not present

## 2019-11-14 DIAGNOSIS — M4125 Other idiopathic scoliosis, thoracolumbar region: Secondary | ICD-10-CM | POA: Diagnosis not present

## 2019-11-14 DIAGNOSIS — S335XXA Sprain of ligaments of lumbar spine, initial encounter: Secondary | ICD-10-CM | POA: Diagnosis not present

## 2019-11-16 ENCOUNTER — Telehealth (INDEPENDENT_AMBULATORY_CARE_PROVIDER_SITE_OTHER): Payer: Self-pay | Admitting: *Deleted

## 2019-11-16 ENCOUNTER — Other Ambulatory Visit (INDEPENDENT_AMBULATORY_CARE_PROVIDER_SITE_OTHER): Payer: Self-pay | Admitting: *Deleted

## 2019-11-16 ENCOUNTER — Encounter (INDEPENDENT_AMBULATORY_CARE_PROVIDER_SITE_OTHER): Payer: Self-pay

## 2019-11-16 DIAGNOSIS — R109 Unspecified abdominal pain: Secondary | ICD-10-CM

## 2019-11-16 DIAGNOSIS — R112 Nausea with vomiting, unspecified: Secondary | ICD-10-CM

## 2019-11-16 DIAGNOSIS — R1084 Generalized abdominal pain: Secondary | ICD-10-CM

## 2019-11-16 NOTE — Telephone Encounter (Signed)
CT sch'd 11/28/19, patient aware

## 2019-11-16 NOTE — Telephone Encounter (Signed)
Order has been put in for CT Abdomen/Pelvis with Contrast Please arrange.

## 2019-11-16 NOTE — Addendum Note (Signed)
Addended by: Grayland Ormond on: 11/16/2019 12:06 PM   Modules accepted: Orders

## 2019-11-28 ENCOUNTER — Ambulatory Visit (HOSPITAL_COMMUNITY)
Admission: RE | Admit: 2019-11-28 | Discharge: 2019-11-28 | Disposition: A | Discharge status: Home / Self Care | Payer: BC Managed Care – PPO | Source: Ambulatory Visit | Attending: Internal Medicine | Admitting: Internal Medicine

## 2019-11-28 ENCOUNTER — Other Ambulatory Visit: Payer: Self-pay

## 2019-11-28 DIAGNOSIS — N200 Calculus of kidney: Secondary | ICD-10-CM | POA: Diagnosis not present

## 2019-11-28 DIAGNOSIS — R109 Unspecified abdominal pain: Secondary | ICD-10-CM

## 2019-11-28 DIAGNOSIS — R112 Nausea with vomiting, unspecified: Secondary | ICD-10-CM

## 2019-11-28 MED ORDER — IOHEXOL 300 MG/ML  SOLN
100.0000 mL | Freq: Once | INTRAMUSCULAR | Status: AC | PRN
  Administered 2019-11-28: 100 mL via INTRAVENOUS

## 2019-12-01 ENCOUNTER — Telehealth (INDEPENDENT_AMBULATORY_CARE_PROVIDER_SITE_OTHER): Payer: Self-pay | Admitting: *Deleted

## 2019-12-01 ENCOUNTER — Other Ambulatory Visit (INDEPENDENT_AMBULATORY_CARE_PROVIDER_SITE_OTHER): Payer: Self-pay | Admitting: Internal Medicine

## 2019-12-01 ENCOUNTER — Encounter (INDEPENDENT_AMBULATORY_CARE_PROVIDER_SITE_OTHER): Payer: Self-pay

## 2019-12-01 MED ORDER — DEXLANSOPRAZOLE 60 MG PO CPDR: 60.0000 mg | DELAYED_RELEASE_CAPSULE | Freq: Every day | ORAL | 1 refills | Status: DC

## 2019-12-01 NOTE — Telephone Encounter (Signed)
Hello, I wanted to see if I could get a refill sent to [facility] for the dexlansoprazole 60 MG capsule medication so that I can get it in a 90 day supply. Hoping to get that filled today (before year end) . Thanks    Misty Mcdowell this was in My Chart and directed to [place]. Would you please address?

## 2019-12-01 NOTE — Telephone Encounter (Signed)
Dexilant refill sent to [facility] as requested. Thanks!

## 2019-12-14 DIAGNOSIS — S134XXA Sprain of ligaments of cervical spine, initial encounter: Secondary | ICD-10-CM | POA: Diagnosis not present

## 2019-12-14 DIAGNOSIS — S233XXA Sprain of ligaments of thoracic spine, initial encounter: Secondary | ICD-10-CM | POA: Diagnosis not present

## 2019-12-14 DIAGNOSIS — G441 Vascular headache, not elsewhere classified: Secondary | ICD-10-CM | POA: Diagnosis not present

## 2019-12-14 DIAGNOSIS — M47816 Spondylosis without myelopathy or radiculopathy, lumbar region: Secondary | ICD-10-CM | POA: Diagnosis not present

## 2019-12-23 DIAGNOSIS — Z23 Encounter for immunization: Secondary | ICD-10-CM | POA: Diagnosis not present

## 2020-01-09 DIAGNOSIS — E782 Mixed hyperlipidemia: Secondary | ICD-10-CM | POA: Diagnosis not present

## 2020-01-09 DIAGNOSIS — M19041 Primary osteoarthritis, right hand: Secondary | ICD-10-CM | POA: Diagnosis not present

## 2020-01-09 DIAGNOSIS — K219 Gastro-esophageal reflux disease without esophagitis: Secondary | ICD-10-CM | POA: Diagnosis not present

## 2020-01-09 DIAGNOSIS — M1990 Unspecified osteoarthritis, unspecified site: Secondary | ICD-10-CM | POA: Diagnosis not present

## 2020-01-11 DIAGNOSIS — K219 Gastro-esophageal reflux disease without esophagitis: Secondary | ICD-10-CM | POA: Diagnosis not present

## 2020-01-11 DIAGNOSIS — M419 Scoliosis, unspecified: Secondary | ICD-10-CM | POA: Diagnosis not present

## 2020-01-11 DIAGNOSIS — E782 Mixed hyperlipidemia: Secondary | ICD-10-CM | POA: Diagnosis not present

## 2020-01-11 DIAGNOSIS — Z6824 Body mass index (BMI) 24.0-24.9, adult: Secondary | ICD-10-CM | POA: Diagnosis not present

## 2020-01-11 DIAGNOSIS — M1990 Unspecified osteoarthritis, unspecified site: Secondary | ICD-10-CM | POA: Diagnosis not present

## 2020-01-16 DIAGNOSIS — S233XXA Sprain of ligaments of thoracic spine, initial encounter: Secondary | ICD-10-CM | POA: Diagnosis not present

## 2020-01-16 DIAGNOSIS — G441 Vascular headache, not elsewhere classified: Secondary | ICD-10-CM | POA: Diagnosis not present

## 2020-01-16 DIAGNOSIS — S134XXA Sprain of ligaments of cervical spine, initial encounter: Secondary | ICD-10-CM | POA: Diagnosis not present

## 2020-01-16 DIAGNOSIS — M47816 Spondylosis without myelopathy or radiculopathy, lumbar region: Secondary | ICD-10-CM | POA: Diagnosis not present

## 2020-01-17 ENCOUNTER — Encounter (INDEPENDENT_AMBULATORY_CARE_PROVIDER_SITE_OTHER): Payer: Self-pay

## 2020-01-19 DIAGNOSIS — Z23 Encounter for immunization: Secondary | ICD-10-CM | POA: Diagnosis not present

## 2020-01-22 ENCOUNTER — Telehealth (INDEPENDENT_AMBULATORY_CARE_PROVIDER_SITE_OTHER): Payer: Self-pay | Admitting: Nurse Practitioner

## 2020-01-22 DIAGNOSIS — K219 Gastro-esophageal reflux disease without esophagitis: Secondary | ICD-10-CM

## 2020-01-22 NOTE — Telephone Encounter (Signed)
[name] pls manage rx. Thx

## 2020-01-23 NOTE — Telephone Encounter (Signed)
Tammy - I sent Dexilant to pharmacy recently as well - can you verify patient not taking both dexilant and omperazole

## 2020-01-23 NOTE — Telephone Encounter (Signed)
Patient was called. She was taking Dexilant but her Insurance , the cost would be $800.00. She has the Omeprazole and plans to start taking that today twice a day. She was also prescribed Famotidine at bedtime for breakthrough symptoms.  I have ask patient to call h er Insurance underwriter t find out which PPI's they will cover. This was a few weeks ago She states that she will try and do that today.  She will let us know.

## 2020-01-24 NOTE — Telephone Encounter (Signed)
Noted  

## 2020-01-24 NOTE — Telephone Encounter (Signed)
Would recommend she try the omeprazole for a few weeks, [employer] will likely require her to try and fail all PPIs on formulary prior to paying for [place]. Thanks.

## 2020-02-20 DIAGNOSIS — G441 Vascular headache, not elsewhere classified: Secondary | ICD-10-CM | POA: Diagnosis not present

## 2020-02-20 DIAGNOSIS — S233XXA Sprain of ligaments of thoracic spine, initial encounter: Secondary | ICD-10-CM | POA: Diagnosis not present

## 2020-02-20 DIAGNOSIS — S134XXA Sprain of ligaments of cervical spine, initial encounter: Secondary | ICD-10-CM | POA: Diagnosis not present

## 2020-02-20 DIAGNOSIS — M47816 Spondylosis without myelopathy or radiculopathy, lumbar region: Secondary | ICD-10-CM | POA: Diagnosis not present

## 2020-03-19 ENCOUNTER — Ambulatory Visit (INDEPENDENT_AMBULATORY_CARE_PROVIDER_SITE_OTHER): Payer: BC Managed Care – PPO | Admitting: Internal Medicine

## 2020-03-19 DIAGNOSIS — S233XXA Sprain of ligaments of thoracic spine, initial encounter: Secondary | ICD-10-CM | POA: Diagnosis not present

## 2020-03-19 DIAGNOSIS — M47816 Spondylosis without myelopathy or radiculopathy, lumbar region: Secondary | ICD-10-CM | POA: Diagnosis not present

## 2020-03-19 DIAGNOSIS — S134XXA Sprain of ligaments of cervical spine, initial encounter: Secondary | ICD-10-CM | POA: Diagnosis not present

## 2020-03-19 DIAGNOSIS — G441 Vascular headache, not elsewhere classified: Secondary | ICD-10-CM | POA: Diagnosis not present

## 2020-03-27 ENCOUNTER — Ambulatory Visit (INDEPENDENT_AMBULATORY_CARE_PROVIDER_SITE_OTHER): Payer: BC Managed Care – PPO | Admitting: Internal Medicine

## 2020-04-25 DIAGNOSIS — M1990 Unspecified osteoarthritis, unspecified site: Secondary | ICD-10-CM | POA: Diagnosis not present

## 2020-04-25 DIAGNOSIS — Z6825 Body mass index (BMI) 25.0-25.9, adult: Secondary | ICD-10-CM | POA: Diagnosis not present

## 2020-05-02 DIAGNOSIS — S134XXA Sprain of ligaments of cervical spine, initial encounter: Secondary | ICD-10-CM | POA: Diagnosis not present

## 2020-05-02 DIAGNOSIS — S233XXA Sprain of ligaments of thoracic spine, initial encounter: Secondary | ICD-10-CM | POA: Diagnosis not present

## 2020-05-02 DIAGNOSIS — G441 Vascular headache, not elsewhere classified: Secondary | ICD-10-CM | POA: Diagnosis not present

## 2020-05-02 DIAGNOSIS — M47816 Spondylosis without myelopathy or radiculopathy, lumbar region: Secondary | ICD-10-CM | POA: Diagnosis not present

## 2020-05-07 ENCOUNTER — Encounter (INDEPENDENT_AMBULATORY_CARE_PROVIDER_SITE_OTHER): Payer: Self-pay

## 2020-05-11 ENCOUNTER — Other Ambulatory Visit (INDEPENDENT_AMBULATORY_CARE_PROVIDER_SITE_OTHER): Payer: Self-pay | Admitting: *Deleted

## 2020-05-11 DIAGNOSIS — K5900 Constipation, unspecified: Secondary | ICD-10-CM

## 2020-05-11 MED ORDER — LINACLOTIDE 145 MCG PO CAPS: 145.0000 ug | ORAL_CAPSULE | Freq: Every day | ORAL | 1 refills | Status: DC

## 2020-05-11 MED ORDER — TRULANCE 3 MG PO TABS: 1.0000 | ORAL_TABLET | Freq: Every day | ORAL | 1 refills | Status: DC

## 2020-05-28 ENCOUNTER — Encounter (INDEPENDENT_AMBULATORY_CARE_PROVIDER_SITE_OTHER): Payer: Self-pay | Admitting: *Deleted

## 2020-05-30 DIAGNOSIS — S233XXA Sprain of ligaments of thoracic spine, initial encounter: Secondary | ICD-10-CM | POA: Diagnosis not present

## 2020-05-30 DIAGNOSIS — G441 Vascular headache, not elsewhere classified: Secondary | ICD-10-CM | POA: Diagnosis not present

## 2020-05-30 DIAGNOSIS — S134XXA Sprain of ligaments of cervical spine, initial encounter: Secondary | ICD-10-CM | POA: Diagnosis not present

## 2020-05-30 DIAGNOSIS — M47816 Spondylosis without myelopathy or radiculopathy, lumbar region: Secondary | ICD-10-CM | POA: Diagnosis not present

## 2020-06-27 DIAGNOSIS — S233XXA Sprain of ligaments of thoracic spine, initial encounter: Secondary | ICD-10-CM | POA: Diagnosis not present

## 2020-06-27 DIAGNOSIS — S134XXA Sprain of ligaments of cervical spine, initial encounter: Secondary | ICD-10-CM | POA: Diagnosis not present

## 2020-06-27 DIAGNOSIS — M47816 Spondylosis without myelopathy or radiculopathy, lumbar region: Secondary | ICD-10-CM | POA: Diagnosis not present

## 2020-06-27 DIAGNOSIS — G441 Vascular headache, not elsewhere classified: Secondary | ICD-10-CM | POA: Diagnosis not present

## 2020-07-05 DIAGNOSIS — E782 Mixed hyperlipidemia: Secondary | ICD-10-CM | POA: Diagnosis not present

## 2020-07-09 DIAGNOSIS — Z23 Encounter for immunization: Secondary | ICD-10-CM | POA: Diagnosis not present

## 2020-07-09 DIAGNOSIS — E782 Mixed hyperlipidemia: Secondary | ICD-10-CM | POA: Diagnosis not present

## 2020-07-09 DIAGNOSIS — M1990 Unspecified osteoarthritis, unspecified site: Secondary | ICD-10-CM | POA: Diagnosis not present

## 2020-07-09 DIAGNOSIS — M419 Scoliosis, unspecified: Secondary | ICD-10-CM | POA: Diagnosis not present

## 2020-07-09 DIAGNOSIS — K219 Gastro-esophageal reflux disease without esophagitis: Secondary | ICD-10-CM | POA: Diagnosis not present

## 2020-07-31 ENCOUNTER — Ambulatory Visit (INDEPENDENT_AMBULATORY_CARE_PROVIDER_SITE_OTHER): Payer: BC Managed Care – PPO | Admitting: Internal Medicine

## 2020-08-14 DIAGNOSIS — S338XXA Sprain of other parts of lumbar spine and pelvis, initial encounter: Secondary | ICD-10-CM | POA: Diagnosis not present

## 2020-08-14 DIAGNOSIS — S134XXA Sprain of ligaments of cervical spine, initial encounter: Secondary | ICD-10-CM | POA: Diagnosis not present

## 2020-08-14 DIAGNOSIS — S233XXA Sprain of ligaments of thoracic spine, initial encounter: Secondary | ICD-10-CM | POA: Diagnosis not present

## 2020-08-29 ENCOUNTER — Other Ambulatory Visit (INDEPENDENT_AMBULATORY_CARE_PROVIDER_SITE_OTHER): Payer: Self-pay | Admitting: Internal Medicine

## 2020-08-29 DIAGNOSIS — K5900 Constipation, unspecified: Secondary | ICD-10-CM

## 2020-08-29 DIAGNOSIS — Z23 Encounter for immunization: Secondary | ICD-10-CM | POA: Diagnosis not present

## 2020-08-29 NOTE — Telephone Encounter (Signed)
Please advise 

## 2020-09-10 DIAGNOSIS — S134XXA Sprain of ligaments of cervical spine, initial encounter: Secondary | ICD-10-CM | POA: Diagnosis not present

## 2020-09-10 DIAGNOSIS — S338XXA Sprain of other parts of lumbar spine and pelvis, initial encounter: Secondary | ICD-10-CM | POA: Diagnosis not present

## 2020-09-10 DIAGNOSIS — Z23 Encounter for immunization: Secondary | ICD-10-CM | POA: Diagnosis not present

## 2020-09-10 DIAGNOSIS — S233XXA Sprain of ligaments of thoracic spine, initial encounter: Secondary | ICD-10-CM | POA: Diagnosis not present

## 2020-10-08 DIAGNOSIS — S233XXA Sprain of ligaments of thoracic spine, initial encounter: Secondary | ICD-10-CM | POA: Diagnosis not present

## 2020-10-08 DIAGNOSIS — S134XXA Sprain of ligaments of cervical spine, initial encounter: Secondary | ICD-10-CM | POA: Diagnosis not present

## 2020-10-08 DIAGNOSIS — S338XXA Sprain of other parts of lumbar spine and pelvis, initial encounter: Secondary | ICD-10-CM | POA: Diagnosis not present

## 2020-10-10 DIAGNOSIS — Z01419 Encounter for gynecological examination (general) (routine) without abnormal findings: Secondary | ICD-10-CM | POA: Diagnosis not present

## 2020-10-10 DIAGNOSIS — N951 Menopausal and female climacteric states: Secondary | ICD-10-CM | POA: Diagnosis not present

## 2020-10-10 DIAGNOSIS — Z6826 Body mass index (BMI) 26.0-26.9, adult: Secondary | ICD-10-CM | POA: Diagnosis not present

## 2020-10-10 DIAGNOSIS — Z975 Presence of (intrauterine) contraceptive device: Secondary | ICD-10-CM | POA: Diagnosis not present

## 2020-11-12 DIAGNOSIS — S134XXA Sprain of ligaments of cervical spine, initial encounter: Secondary | ICD-10-CM | POA: Diagnosis not present

## 2020-11-12 DIAGNOSIS — S233XXA Sprain of ligaments of thoracic spine, initial encounter: Secondary | ICD-10-CM | POA: Diagnosis not present

## 2020-11-12 DIAGNOSIS — S338XXA Sprain of other parts of lumbar spine and pelvis, initial encounter: Secondary | ICD-10-CM | POA: Diagnosis not present

## 2020-11-27 ENCOUNTER — Ambulatory Visit (INDEPENDENT_AMBULATORY_CARE_PROVIDER_SITE_OTHER): Payer: BC Managed Care – PPO | Admitting: Internal Medicine

## 2020-11-27 ENCOUNTER — Other Ambulatory Visit: Payer: Self-pay

## 2020-11-27 ENCOUNTER — Encounter (INDEPENDENT_AMBULATORY_CARE_PROVIDER_SITE_OTHER): Payer: Self-pay | Admitting: Internal Medicine

## 2020-11-27 VITALS — BP 111/73 | HR 76 | Temp 98.2°F | Ht 62.0 in | Wt 146.3 lb

## 2020-11-27 DIAGNOSIS — K5900 Constipation, unspecified: Secondary | ICD-10-CM

## 2020-11-27 DIAGNOSIS — K219 Gastro-esophageal reflux disease without esophagitis: Secondary | ICD-10-CM | POA: Diagnosis not present

## 2020-11-27 DIAGNOSIS — R112 Nausea with vomiting, unspecified: Secondary | ICD-10-CM | POA: Diagnosis not present

## 2020-11-27 DIAGNOSIS — Z860100 Personal history of colon polyps, unspecified: Secondary | ICD-10-CM

## 2020-11-27 MED ORDER — DEXLANSOPRAZOLE 60 MG PO CPDR: 60.0000 mg | DELAYED_RELEASE_CAPSULE | Freq: Every day | ORAL | 1 refills | Status: DC

## 2020-11-27 MED ORDER — ONDANSETRON HCL 4 MG PO TABS: 4.0000 mg | ORAL_TABLET | Freq: Two times a day (BID) | ORAL | 1 refills | Status: AC | PRN

## 2020-11-27 NOTE — Progress Notes (Signed)
Presenting complaint;  Follow for chronic GERD constipation/ History of colonic adenoma. Patient complains of nausea.  Database and subjective:  Patient is 54 year old Caucasian female who is here for scheduled visit. She was last seen over a year ago. Following her last visit in October 2020 she underwent esophagogastroduodenoscopy for poorly controlled symptoms of GERD. EGD revealed irregular GE junction. Biopsy revealed changes of reflux esophagitis but no Barrett's. Gastric biopsy was negative for H. pylori and duodenal biopsy did not show any changes of celiac disease. She also has a history of colonic adenomas. She had 12 mm cecal tubular adenoma removed in August 2017. Family history significant for colon carcinoma in her father who was diagnosed at age 19 and died of metastatic disease at 59. Her mother has had colonic adenomas.  Patient says she is doing well as far as constipation is concerned. Trulance/plecanatide is working. However she is having daily heartburn. She also has frequent nausea. Its almost always in in the morning and associated headache. Nausea does not occur daily. She has occasional vomiting. Last time she threw up was 2 months ago. When she is nauseated she cannot eat. She had an episode over the weekend this month and it lasted for 8 hours. Her appetite is normal. She has gained 8 lbs since her last visit. She feels weight loss may be related to her menopause. He also cannot sleep. She denies hoarseness sore throat or chronic cough. She has noted nausea with milk which she does not drink anymore.   Current Medications: Outpatient Encounter Medications as of 11/27/2020  Medication Sig  . GLUCOSAMINE-CHONDROITIN PO Take by mouth 2 (two) times daily.  Marland Kitchen levonorgestrel (MIRENA) 20 MCG/24HR IUD 1 each by Intrauterine route once.  Marland Kitchen omeprazole (PRILOSEC) 40 MG capsule TAKE 1 CAPSULE BY MOUTH 2 TIMES DAILY, TO BE TAKEN 30 MINUTES BEFORE BREAKFAST AND 30 MINUTES BEFORE  DINNER  . TRULANCE 3 MG TABS TAKE 1 TABLET BY MOUTH EVERY DAY  . dexlansoprazole (DEXILANT) 60 MG capsule Take 1 capsule (60 mg total) by mouth daily before breakfast. (Patient not taking: Reported on 11/27/2020)  . famotidine (PEPCID) 20 MG tablet Take 1 tablet (20 mg total) by mouth at bedtime. (Patient not taking: Reported on 11/27/2020)  . meloxicam (MOBIC) 15 MG tablet Take 15 mg by mouth every evening.  (Patient not taking: Reported on 11/27/2020)  . Multiple Vitamin (MULTIVITAMIN WITH MINERALS) TABS tablet Take 1 tablet by mouth every evening. (Patient not taking: Reported on 11/27/2020)  . Multiple Vitamins-Minerals (IMMUNE SYSTEM BOOSTER PO) Take 1 packet by mouth every evening. (Patient not taking: Reported on 11/27/2020)  . promethazine (PHENERGAN) 12.5 MG tablet Take 1 tablet (12.5 mg total) by mouth 3 (three) times daily as needed for nausea or vomiting. (Patient not taking: Reported on 11/27/2020)  . Zinc 50 MG TABS Take 100 mg by mouth 2 (two) times daily. (Patient not taking: Reported on 11/27/2020)   No facility-administered encounter medications on file as of 11/27/2020.     Objective: Blood pressure 111/73, pulse 76, temperature 98.2 F (36.8 C), temperature source Oral, height 5\' 2"  (1.575 m), weight 146 lb 4.8 oz (66.4 kg). Patient is alert and in no acute distress. She is wearing a mask. Conjunctiva is pink. Sclera is nonicteric Oropharyngeal mucosa is normal. No neck masses or thyromegaly noted. Cardiac exam with regular rhythm normal S1 and S2. No murmur or gallop noted. Lungs are clear to auscultation. Abdomen is symmetrical soft and nontender with organomegaly or masses. No  LE edema or clubbing noted.  Assessment:  #1. Chronic GERD. Omeprazole at a dose of 40 mg daily is not working. She had EGD in December 2020 revealing small sliding hiatal hernia and biopsy from GE junction revealed changes of reflux esophagitis but no Barrett's. She has tried  Dexilant/dexlansoprazole in the past. It worked well but high co-pay was an issue. She believes it would be cost effective now.  #2. Nausea with sporadic vomiting. It is worse in the morning but can occur at other times and usually associated with headache. I suspect nausea is not due to GERD. I wonder if it is part and parcel of her perimenopausal syndrome.  #3. History of colonic adenomas. She had 12 mm cecal tubular adenoma removed in July 20, 2016. She is due for surveillance colonoscopy in August 2022. Her family history significant for CRC in her father at age 76 when he died of metastatic disease 4 years later. Her mother has had colonic adenomas removed as well.  #4. Chronic constipation. Trulance/plecanatide is working. She also remains on high-fiber diet.   Plan:  Discontinue omeprazole. Begin dexlansoprazole 60 mg by mouth 30 minutes before breakfast daily. Patient will call with progress report in 1 month. Ondansetron 4 mg p.o. twice daily as needed if nausea is pronounced to last for more than 30 minutes. She will undergo surveillance colonoscopy in August 2022. Office visit in 1 year.

## 2020-11-27 NOTE — Patient Instructions (Addendum)
Discontinue omeprazole . Begin Dexilant/dexlansoprazole 60 mg by mouth 30 minutes before breakfast daily. Please call us with progress report in 1 month. Colonoscopy to be scheduled in August 2022.

## 2021-01-23 IMAGING — CT CT ABDOMEN W/ CM
3 of 5 series · 16 of 46 positions shown, 18 images · IV contrast (omnipaque)
Comparison: Report from abdominal ultrasound dated 03/08/2015

CLINICAL DATA: Abdominal pain. Nausea and vomiting.

EXAM:
CT ABDOMEN WITH CONTRAST
TECHNIQUE: Multidetector CT imaging of the abdomen was performed using the
standard protocol following bolus administration of intravenous
contrast.
CONTRAST:  100mL OMNIPAQUE IOHEXOL 300 MG/ML  SOLN

[Series 2: axial st · axial · 0.68mm/px · z∈[-582,-382]mm · 10 of 50 slices shown, 12 images]
[im 5/50  soft-tissue]
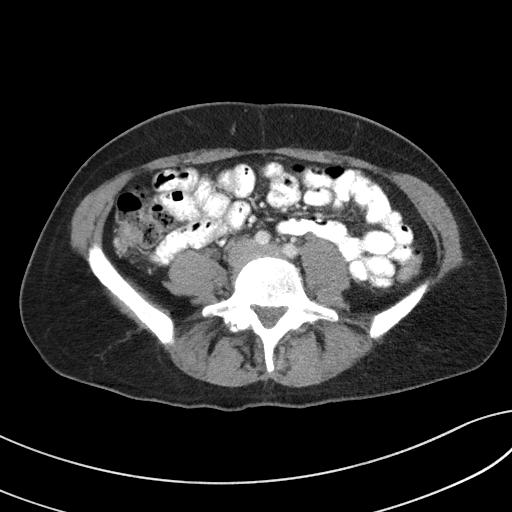
[im 5/50  bone]
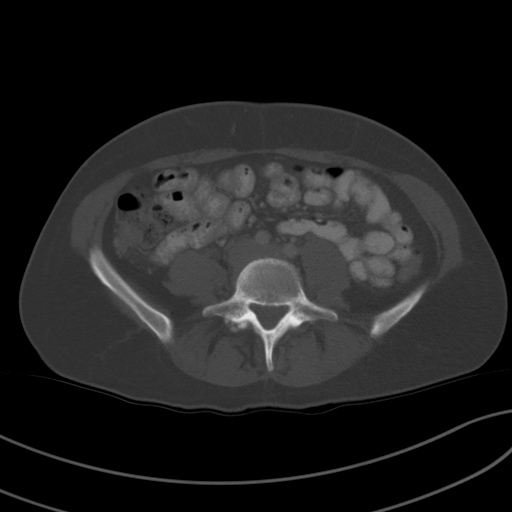
[im 9/50  soft-tissue]
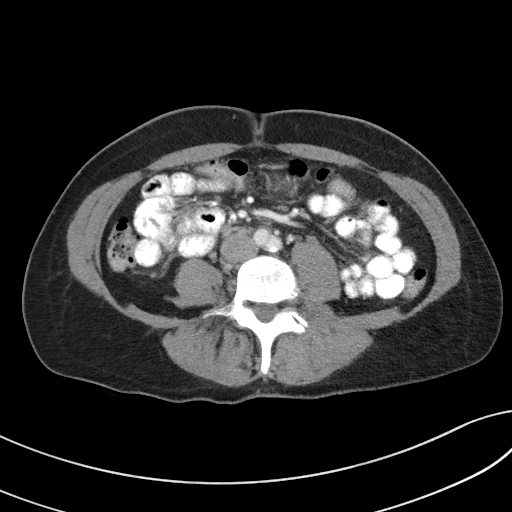
[im 14/50  soft-tissue]
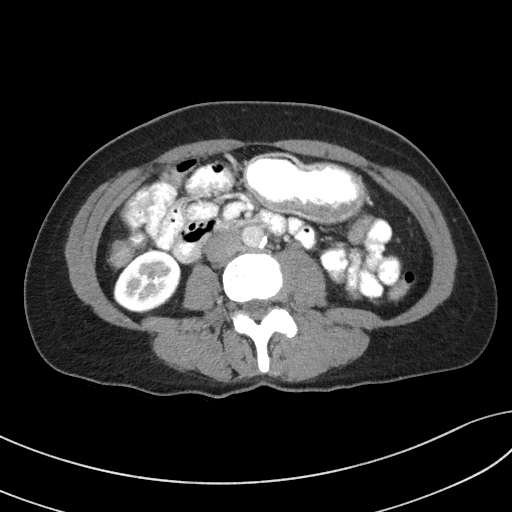
[im 18/50  soft-tissue]
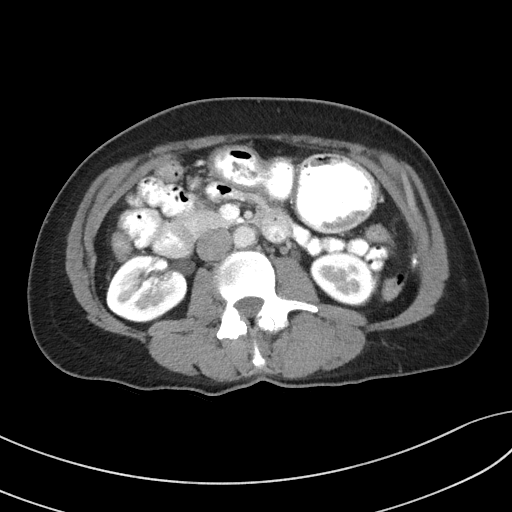
[im 23/50  soft-tissue]
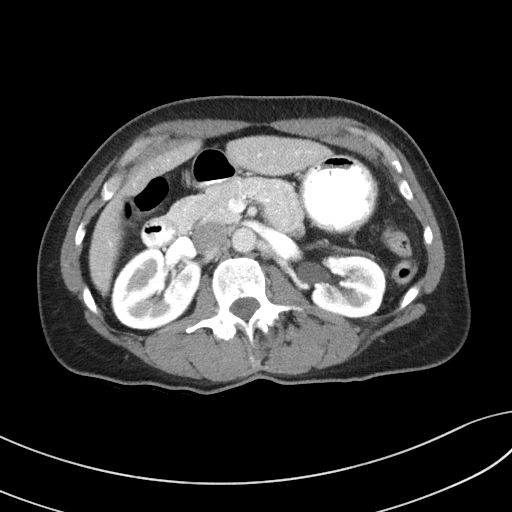
[im 27/50  soft-tissue]
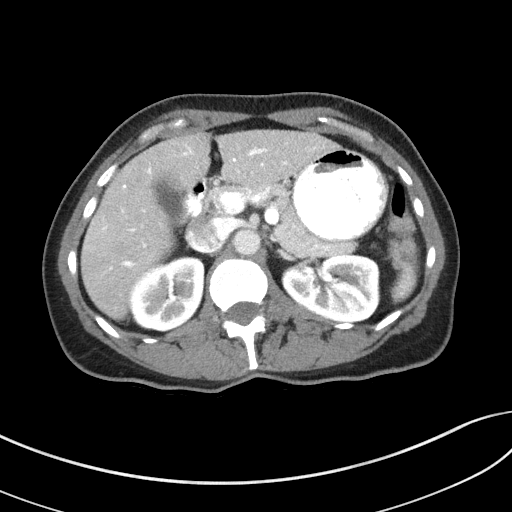
[im 32/50  soft-tissue]
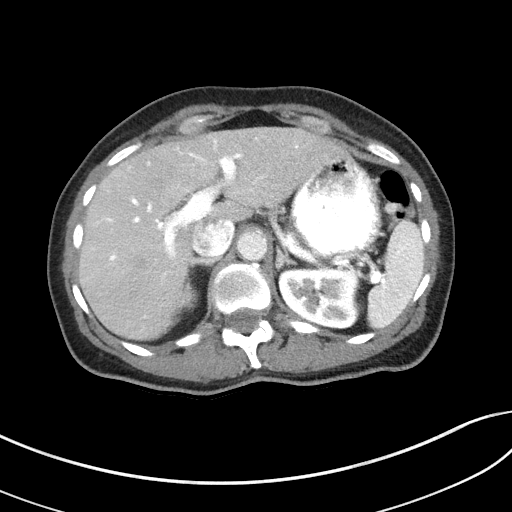
[im 36/50  soft-tissue]
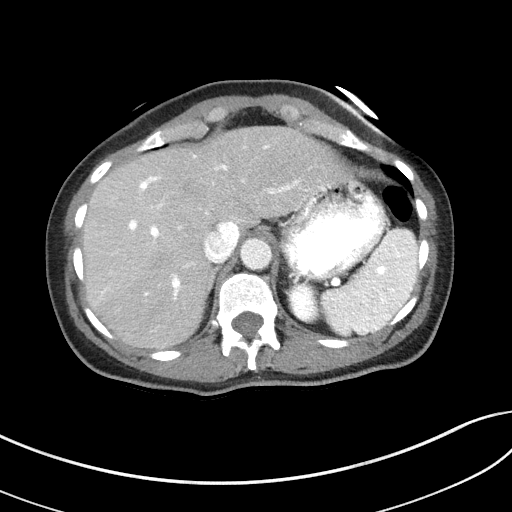
[im 41/50  soft-tissue]
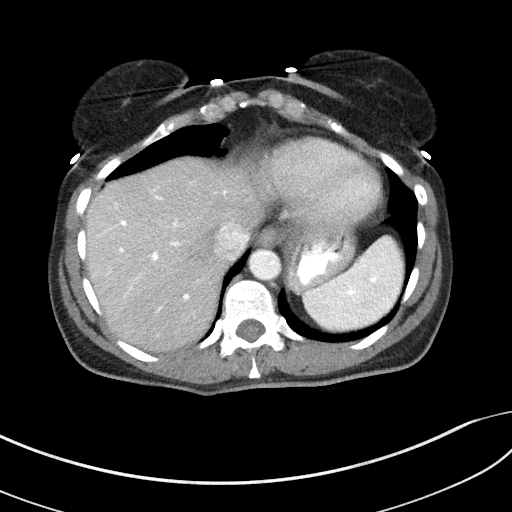
[im 41/50  bone]
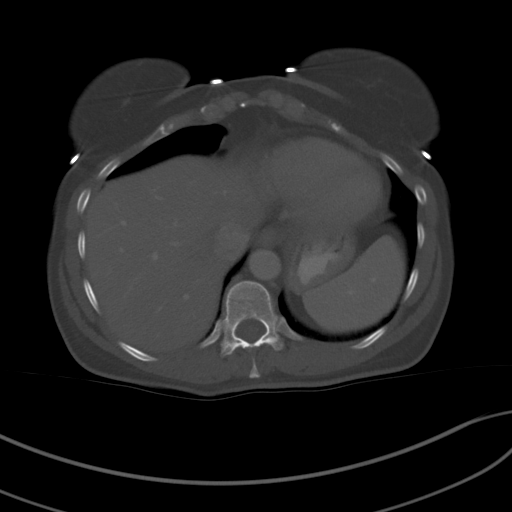
[im 45/50  soft-tissue]
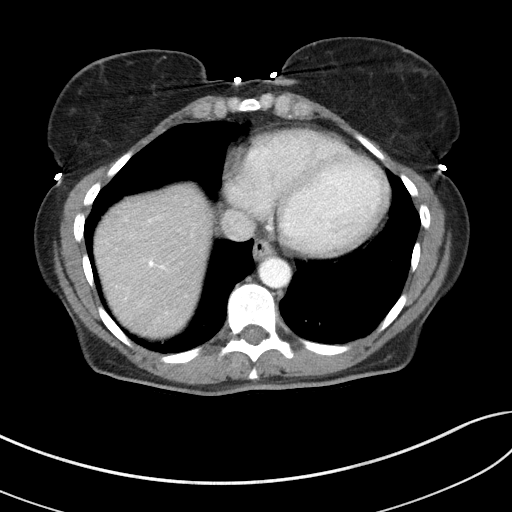

[Series 5: lung bases · axial · 0.68mm/px · z∈[-460,-432]mm · 3 of 57 slices shown]
[im 5/57  bone]
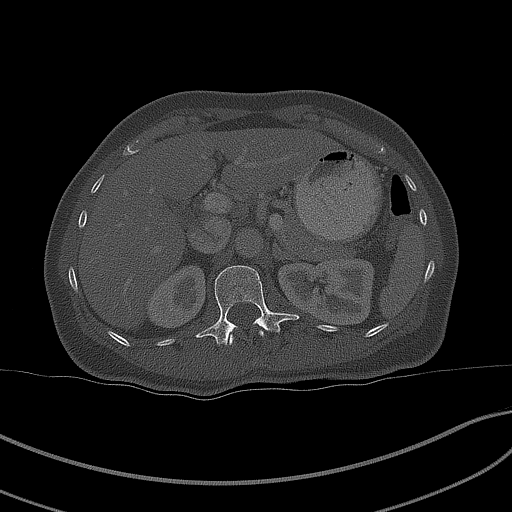
[im 15/57  bone]
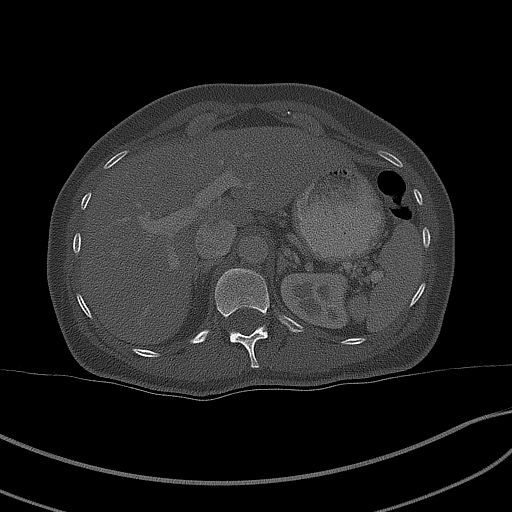
[im 19/57  bone]
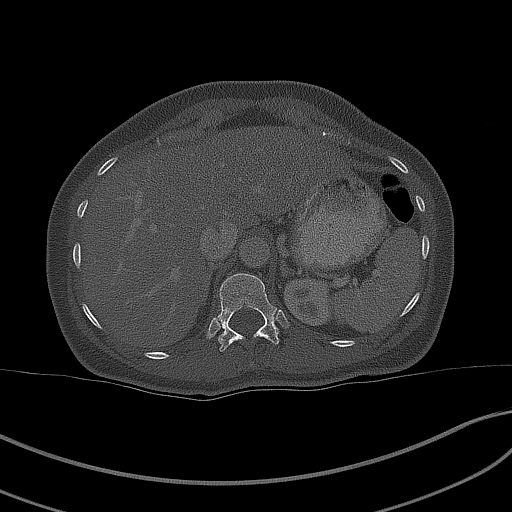

[Series 6: coronal st · coronal · 0.50mm/px · 3 of 80 slices shown]
[im 27/80  soft-tissue]
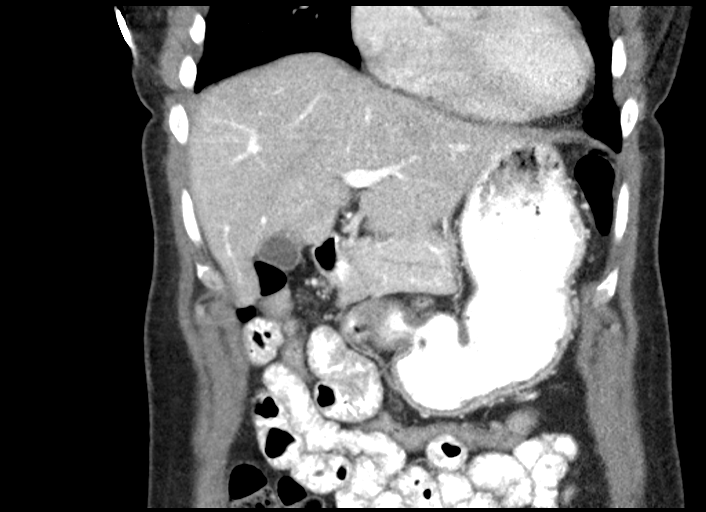
[im 36/80  soft-tissue]
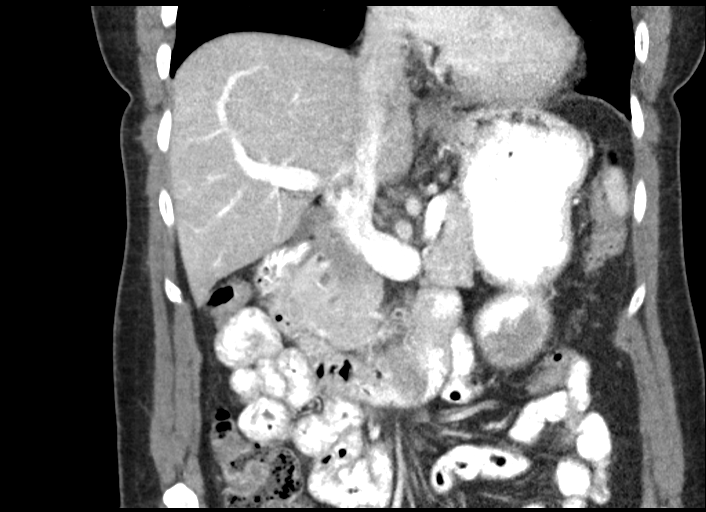
[im 44/80  soft-tissue]
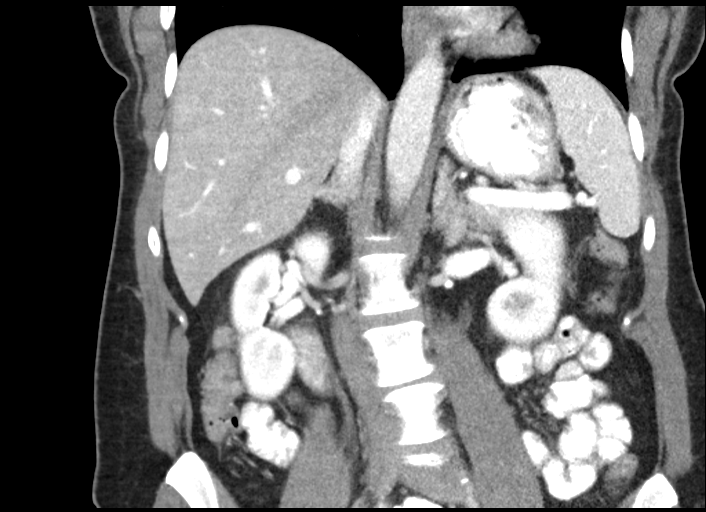

[16 of 46 positions shown; findings below may reference images not displayed]

FINDINGS: Lower chest: Unremarkable

Hepatobiliary: Unremarkable

Pancreas: Unremarkable

Spleen: Unremarkable

Adrenals/Urinary Tract: 2 mm right mid kidney nonobstructive renal
calculus, image [DATE]. the adrenal glands appear normal.

Stomach/Bowel: Unremarkable

Vascular/Lymphatic: Aortoiliac atherosclerotic vascular disease. No
pathologic adenopathy identified.

Other: No supplemental non-categorized findings.

Musculoskeletal: Mild dextroconvex thoracolumbar scoliosis with
rotary component.
IMPRESSION: 1. A cause for the patient's abdominal pain is not identified.
2. Aortic atherosclerosis.
3. 2 mm right mid kidney nonobstructive renal calculus.
4. Mild dextroconvex thoracolumbar scoliosis with rotary component.

Aortic Atherosclerosis ([id number].I).

## 2021-03-12 ENCOUNTER — Other Ambulatory Visit (INDEPENDENT_AMBULATORY_CARE_PROVIDER_SITE_OTHER): Payer: Self-pay | Admitting: Internal Medicine

## 2021-03-12 ENCOUNTER — Encounter (INDEPENDENT_AMBULATORY_CARE_PROVIDER_SITE_OTHER): Payer: Self-pay

## 2021-03-12 MED ORDER — LANSOPRAZOLE 30 MG PO CPDR: 30.0000 mg | DELAYED_RELEASE_CAPSULE | Freq: Two times a day (BID) | ORAL | 5 refills | Status: AC

## 2021-03-12 MED ORDER — LINACLOTIDE 145 MCG PO CAPS: 145.0000 ug | ORAL_CAPSULE | Freq: Every day | ORAL | 5 refills | Status: AC

## 2021-03-12 NOTE — Progress Notes (Signed)
Patient states Dexilant is working but is not the preferred medication and cost would be too much She has been on Trulance but it is not working well.  She has taken Linzess in the past which worked well.  We will discontinue dexlansoprazole and Trulance Begin lansoprazole 30 mg p.o. twice daily.  If lansoprazole works will leave her on twice daily schedule for 3 months at which time dose will be dropped to once daily. Prescription for Linzess 145 mcg p.o. every morning sent to patient's pharmacy.

## 2021-04-08 ENCOUNTER — Encounter (INDEPENDENT_AMBULATORY_CARE_PROVIDER_SITE_OTHER): Payer: Self-pay

## 2021-06-25 ENCOUNTER — Encounter (INDEPENDENT_AMBULATORY_CARE_PROVIDER_SITE_OTHER): Payer: Self-pay | Admitting: *Deleted

## 2021-08-22 ENCOUNTER — Encounter (INDEPENDENT_AMBULATORY_CARE_PROVIDER_SITE_OTHER): Payer: Self-pay

## 2021-08-22 ENCOUNTER — Other Ambulatory Visit (INDEPENDENT_AMBULATORY_CARE_PROVIDER_SITE_OTHER): Payer: Self-pay

## 2021-08-22 ENCOUNTER — Telehealth (INDEPENDENT_AMBULATORY_CARE_PROVIDER_SITE_OTHER): Payer: Self-pay

## 2021-08-22 DIAGNOSIS — Z86010 Personal history of colonic polyps: Secondary | ICD-10-CM

## 2021-08-22 DIAGNOSIS — Z800 Family history of malignant neoplasm of digestive organs: Secondary | ICD-10-CM

## 2021-08-22 MED ORDER — PEG 3350-KCL-NA BICARB-NACL 420 G PO SOLR: 4000.0000 mL | ORAL | 0 refills | Status: AC

## 2021-08-22 MED ORDER — NA SULFATE-K SULFATE-MG SULF 17.5-3.13-1.6 GM/177ML PO SOLN: 177.0000 mL | Freq: Once | ORAL | 0 refills | Status: DC

## 2021-08-22 NOTE — Telephone Encounter (Signed)
LeighAnn Donis Pinder, CMA  

## 2021-08-22 NOTE — Telephone Encounter (Signed)
Misty Mcdowell, CMA  

## 2021-08-22 NOTE — Telephone Encounter (Signed)
Referring MD/PCP: Sasser  Procedure: Tcs  Reason/Indication:  Hx of colon polyps, fam hx of colon ca  Has patient had this procedure before?  yes  If so, when, by whom and where?  2017  Is there a family history of colon cancer?  yes  Who?  What age when diagnosed?  father  Is patient diabetic? If yes, Type 1 or Type 2   no      Does patient have prosthetic heart valve or mechanical valve?  no  Do you have a pacemaker/defibrillator?  no  Has patient ever had endocarditis/atrial fibrillation? no  Does patient use oxygen? no  Has patient had joint replacement within last 12 months?  no  Is patient constipated or do they take laxatives? yes  Does patient have a history of alcohol/drug use?  no  Have you had a stroke/heart attack last 6 mths? no  Do you take medicine for weight loss?  no  For female patients,: do you still have your menstrual cycle? no  Is patient on blood thinner such as Coumadin, Plavix and/or Aspirin? no  Medications: lansoprazole 30 mg bid, linzess 145 mcg daily, glucosamine 1500 mg bid, chondroitin 1200 mg bid  Allergies: nkda  Medication Adjustment per Dr Rehman/ none  Procedure date & time: 09/19/21 7:30 am

## 2021-09-19 ENCOUNTER — Encounter (HOSPITAL_COMMUNITY)
Admission: RE | Disposition: A | Discharge status: Home / Self Care | Payer: Self-pay | Source: Home / Self Care | Attending: Internal Medicine

## 2021-09-19 ENCOUNTER — Other Ambulatory Visit: Payer: Self-pay

## 2021-09-19 ENCOUNTER — Ambulatory Visit (HOSPITAL_COMMUNITY)
Admission: RE | Admit: 2021-09-19 | Discharge: 2021-09-19 | Disposition: A | Discharge status: Home / Self Care | Payer: BC Managed Care – PPO | Attending: Internal Medicine | Admitting: Internal Medicine

## 2021-09-19 ENCOUNTER — Encounter (HOSPITAL_COMMUNITY): Payer: Self-pay | Admitting: Internal Medicine

## 2021-09-19 DIAGNOSIS — Z09 Encounter for follow-up examination after completed treatment for conditions other than malignant neoplasm: Secondary | ICD-10-CM | POA: Diagnosis not present

## 2021-09-19 DIAGNOSIS — D125 Benign neoplasm of sigmoid colon: Secondary | ICD-10-CM | POA: Insufficient documentation

## 2021-09-19 DIAGNOSIS — Z800 Family history of malignant neoplasm of digestive organs: Secondary | ICD-10-CM | POA: Insufficient documentation

## 2021-09-19 DIAGNOSIS — Z801 Family history of malignant neoplasm of trachea, bronchus and lung: Secondary | ICD-10-CM | POA: Insufficient documentation

## 2021-09-19 DIAGNOSIS — Z1211 Encounter for screening for malignant neoplasm of colon: Secondary | ICD-10-CM

## 2021-09-19 DIAGNOSIS — Z86010 Personal history of colonic polyps: Secondary | ICD-10-CM | POA: Diagnosis not present

## 2021-09-19 DIAGNOSIS — K644 Residual hemorrhoidal skin tags: Secondary | ICD-10-CM | POA: Insufficient documentation

## 2021-09-19 HISTORY — PX: BIOPSY: SHX5522

## 2021-09-19 HISTORY — PX: COLONOSCOPY: SHX5424

## 2021-09-19 LAB — HM COLONOSCOPY

## 2021-09-19 SURGERY — COLONOSCOPY
Anesthesia: Moderate Sedation

## 2021-09-19 MED ORDER — MEPERIDINE HCL 50 MG/ML IJ SOLN
INTRAMUSCULAR | Status: DC | PRN
  Administered 2021-09-19 (×2): 25 mg

## 2021-09-19 MED ORDER — MIDAZOLAM HCL 5 MG/5ML IJ SOLN
INTRAMUSCULAR | Status: DC | PRN
  Administered 2021-09-19: 1 mg via INTRAVENOUS
  Administered 2021-09-19: 2 mg via INTRAVENOUS
  Administered 2021-09-19: 1 mg via INTRAVENOUS
  Administered 2021-09-19: 2 mg via INTRAVENOUS

## 2021-09-19 MED ORDER — MEPERIDINE HCL 50 MG/ML IJ SOLN
INTRAMUSCULAR | Status: AC
  Filled 2021-09-19: qty 1

## 2021-09-19 MED ORDER — MIDAZOLAM HCL 5 MG/5ML IJ SOLN
INTRAMUSCULAR | Status: AC
  Filled 2021-09-19: qty 10

## 2021-09-19 MED ORDER — SODIUM CHLORIDE 0.9 % IV SOLN
INTRAVENOUS | Status: DC
  Administered 2021-09-19: 1000 mL via INTRAVENOUS

## 2021-09-19 NOTE — Discharge Instructions (Signed)
No aspirin or NSAIDs for 24 hours. Resume scheduled medications and diet as before. No driving for 24 hours. Physician will call with biopsy results. Next colonoscopy in 5 years.

## 2021-09-19 NOTE — Op Note (Signed)
[facility] Patient Name: Misty Mcdowell Procedure Date: 09/19/2021 7:18 AM MRN: [id number] Date of Birth: [birth year] Attending MD: Hildred Laser , MD CSN: [id number] Age: 55 Admit Type: Outpatient Procedure:                Colonoscopy Indications:              High risk colon cancer surveillance: Personal                            history of colonic polyps Providers:                Hildred Laser, MD, Lambert Mody, Rosina Lowenstein,                            RN Referring MD:             Consuello Masse, MD Medicines:                Meperidine 50 mg IV, Midazolam 6 mg IV Complications:            No immediate complications. Estimated Blood Loss:     Estimated blood loss was minimal. Procedure:                Pre-Anesthesia Assessment:                           - Prior to the procedure, a History and Physical                            was performed, and patient medications and                            allergies were reviewed. The patient's tolerance of                            previous anesthesia was also reviewed. The risks                            and benefits of the procedure and the sedation                            options and risks were discussed with the patient.                            All questions were answered, and informed consent                            was obtained. Prior Anticoagulants: The patient has                            taken no previous anticoagulant or antiplatelet                            agents except for aspirin. ASA Grade Assessment: II                            -  A patient with mild systemic disease. After                            reviewing the risks and benefits, the patient was                            deemed in satisfactory condition to undergo the                            procedure.                           After obtaining informed consent, the colonoscope                            was passed under direct vision.  Throughout the                            procedure, the patient's blood pressure, pulse, and                            oxygen saturations were monitored continuously. The                            [id number]-HQ190L ([id number]) scope was introduced through                            the anus and advanced to the the cecum, identified                            by appendiceal orifice and ileocecal valve. The                            colonoscopy was performed without difficulty. The                            patient tolerated the procedure well. The quality                            of the bowel preparation was excellent. The                            ileocecal valve, appendiceal orifice, and rectum                            were photographed. Scope In: 7:46:10 AM Scope Out: 8:02:48 AM Scope Withdrawal Time: 0 hours 8 minutes 40 seconds  Total Procedure Duration: 0 hours 16 minutes 38 seconds  Findings:      The perianal and digital rectal examinations were normal.      A post polypectomy scar was found in the cecum. The scar tissue was       healthy in appearance. There was no evidence of the previous polyp.      A diminutive polyp was found in the proximal sigmoid colon. Biopsies  were taken with a cold forceps for histology.      A single small-mouthed diverticulum was found in the sigmoid colon.      External hemorrhoids were found during retroflexion. The hemorrhoids       were small. Impression:               - Post-polypectomy scar in the cecum.                           - One diminutive polyp in the proximal sigmoid                            colon. Biopsied.                           - Single small diverticulum at sigmoid colon                           - External hemorrhoids. Moderate Sedation:      Moderate (conscious) sedation was administered by the endoscopy nurse       and supervised by the endoscopist. The following parameters were       monitored: oxygen saturation,  heart rate, blood pressure, CO2       capnography and response to care. Total physician intraservice time was       21 minutes. Recommendation:           - Patient has a contact number available for                            emergencies. The signs and symptoms of potential                            delayed complications were discussed with the                            patient. Return to normal activities tomorrow.                            Written discharge instructions were provided to the                            patient.                           - Resume previous diet today.                           - Continue present medications.                           - No aspirin, ibuprofen, naproxen, or other                            non-steroidal anti-inflammatory drugs for 1 day.                           - Await pathology results.                           -  Repeat colonoscopy in 5 years for surveillance. Procedure Code(s):        --- Professional ---                           [phone], Colonoscopy, flexible; with biopsy, single                            or multiple                           G0500, Moderate sedation services provided by the                            same physician or other qualified health care                            professional performing a gastrointestinal                            endoscopic service that sedation supports,                            requiring the presence of an independent trained                            observer to assist in the monitoring of the                            patient's level of consciousness and physiological                            status; initial 15 minutes of intra-service time;                            patient age 72 years or older (additional time may                            be reported with [phone], as appropriate) Diagnosis Code(s):        --- Professional ---                           Z86.010, Personal  history of colonic polyps                           Z98.890, Other specified postprocedural states                           K63.5, Polyp of colon                           K64.4, Residual hemorrhoidal skin tags CPT copyright 2019 [employer]. All rights reserved. The codes documented in this report are preliminary and upon coder review may  be revised to meet current compliance requirements. Hildred Laser, MD Hildred Laser, MD 09/19/2021 8:13:10 AM This report has been signed electronically. Number of Addenda:  0 

## 2021-09-19 NOTE — H&P (Signed)
Misty Mcdowell is an 55 y.o. female.   Chief Complaint: Patient is here for colonoscopy HPI: Patient is 55 year old Caucasian female who underwent screening colonoscopy in August 2017 with removal of 13 mm tubular adenoma from cecum.  She is not returning for surveillance examination.  She has no GI complaints.  She has good appetite.  Her weight has been stable.  She denies abdominal pain diarrhea constipation rectal bleeding. Family history significant for colon carcinoma in his father who was 76 at the time of diagnosis and died 2 years later. Patient does not take any aspirin or any coagulants.  Past Medical History:  Diagnosis Date   Anemia    Scoliosis     Past Surgical History:  Procedure Laterality Date   BIOPSY  11/03/2019   Procedure: BIOPSY;  Surgeon: Rogene Houston, MD;  Location: [facility];  Service: Endoscopy;;  duodenum, gastric, ge junction   COLONOSCOPY  06/26/2011   Procedure: COLONOSCOPY;  Surgeon: Rogene Houston, MD;  Location: [facility];  Service: Endoscopy;  Laterality: N/A;   COLONOSCOPY N/A 07/03/2016   Procedure: COLONOSCOPY;  Surgeon: Rogene Houston, MD;  Location: [facility];  Service: Endoscopy;  Laterality: N/A;  930   ESOPHAGOGASTRODUODENOSCOPY N/A 09/26/2015   Procedure: ESOPHAGOGASTRODUODENOSCOPY (EGD);  Surgeon: Rogene Houston, MD;  Location: [facility];  Service: Endoscopy;  Laterality: N/A;  3:00    ESOPHAGOGASTRODUODENOSCOPY N/A 11/03/2019   Procedure: ESOPHAGOGASTRODUODENOSCOPY (EGD);  Surgeon: Rogene Houston, MD;  Location: [facility];  Service: Endoscopy;  Laterality: N/A;  200pm   TUBAL LIGATION      Family History  Problem Relation Age of Onset   Hypertension Mother    Colon cancer Father    Lung cancer Paternal Grandmother    Social History:  reports that she has never smoked. She has never used smokeless tobacco. She reports current alcohol use. She reports that she does not use drugs.  Allergies: No Known  Allergies  Medications Prior to Admission  Medication Sig Dispense Refill   GLUCOSAMINE-CHONDROITIN PO Take 2 tablets by mouth every evening. 1500 mg -1200 mg     lansoprazole (PREVACID) 30 MG capsule Take 1 capsule (30 mg total) by mouth 2 (two) times daily before a meal. (Patient taking differently: Take 30 mg by mouth daily before supper.) 60 capsule 5   levonorgestrel (MIRENA) 20 MCG/24HR IUD 1 each by Intrauterine route once.     linaclotide (LINZESS) 145 MCG CAPS capsule Take 1 capsule (145 mcg total) by mouth daily before breakfast. (Patient taking differently: Take 145 mcg by mouth daily at 2 am.) 30 capsule 5   Multiple Vitamin (MULTIVITAMIN WITH MINERALS) TABS tablet Take 1 tablet by mouth every 30 (thirty) days. Very sporadically     polyethylene glycol-electrolytes (TRILYTE) 420 g solution Take 4,000 mLs by mouth as directed. 4000 mL 0   rosuvastatin (CRESTOR) 5 MG tablet Take 5 mg by mouth every other day. At night.     aspirin-acetaminophen-caffeine (EXCEDRIN MIGRAINE) 250-250-65 MG tablet Take 1 tablet by mouth daily as needed for headache (pain.).     Aspirin-Acetaminophen-Caffeine (GOODYS EXTRA STRENGTH) 500-325-65 MG PACK Take 1 packet by mouth daily as needed (pain/headaches.).     Melatonin 10 MG TABS Take 10 mg by mouth at bedtime as needed (sleep).     naproxen sodium (ALEVE) 220 MG tablet Take 220 mg by mouth daily as needed (pain/headaches.).     ondansetron (ZOFRAN) 4 MG tablet Take 1 tablet (  4 mg total) by mouth 2 (two) times daily as needed for nausea or vomiting. 30 tablet 1    No results found for this or any previous visit (from the past 48 hour(s)). No results found.  Review of Systems  Blood pressure 113/78, pulse 78, temperature 98.3 F (36.8 C), temperature source Oral, resp. rate 16, height 5\' 2"  (1.575 m), weight 66.7 kg, SpO2 95 %. Physical Exam HENT:     Mouth/Throat:     Mouth: Mucous membranes are moist.     Pharynx: Oropharynx is clear.  Eyes:      General: No scleral icterus.    Conjunctiva/sclera: Conjunctivae normal.  Cardiovascular:     Rate and Rhythm: Normal rate and regular rhythm.     Heart sounds: Normal heart sounds. No murmur heard. Pulmonary:     Effort: Pulmonary effort is normal.     Breath sounds: Normal breath sounds.  Abdominal:     General: There is no distension.     Palpations: Abdomen is soft. There is mass.     Tenderness: There is no abdominal tenderness.  Musculoskeletal:        General: No swelling.     Cervical back: Neck supple.  Lymphadenopathy:     Cervical: No cervical adenopathy.  Skin:    General: Skin is warm and dry.  Neurological:     Mental Status: She is alert.     Assessment/Plan  History of colonic adenoma Family history of CRC in first-degree relative at age 14. Surveillance colonoscopy.  Hildred Laser, MD 09/19/2021, 7:32 AM

## 2021-09-24 LAB — SURGICAL PATHOLOGY

## 2021-09-25 ENCOUNTER — Encounter (HOSPITAL_COMMUNITY): Payer: Self-pay | Admitting: Internal Medicine

## 2021-09-27 ENCOUNTER — Encounter (INDEPENDENT_AMBULATORY_CARE_PROVIDER_SITE_OTHER): Payer: Self-pay | Admitting: *Deleted
